# Patient Record
Sex: Male | Born: 1965 | Race: White | Hispanic: No | Marital: Married | Smoking: Current every day smoker
Health system: Southern US, Community
[De-identification: ages and names within clinical notes are randomized; demographics above are authoritative.]

## PROBLEM LIST (undated history)

## (undated) DIAGNOSIS — R569 Unspecified convulsions: Secondary | ICD-10-CM

---

## 2011-01-02 ENCOUNTER — Emergency Department (HOSPITAL_COMMUNITY)
Admission: EM | Admit: 2011-01-02 | Discharge: 2011-01-02 | Disposition: A | Discharge status: Home / Self Care | Payer: 59 | Attending: Emergency Medicine | Admitting: Emergency Medicine

## 2011-01-02 DIAGNOSIS — H9209 Otalgia, unspecified ear: Secondary | ICD-10-CM | POA: Insufficient documentation

## 2011-01-02 DIAGNOSIS — H6500 Acute serous otitis media, unspecified ear: Secondary | ICD-10-CM | POA: Insufficient documentation

## 2015-06-08 ENCOUNTER — Emergency Department (HOSPITAL_COMMUNITY)
Admission: EM | Admit: 2015-06-08 | Discharge: 2015-06-08 | Disposition: A | Discharge status: Home / Self Care | Payer: 59 | Attending: Emergency Medicine | Admitting: Emergency Medicine

## 2015-06-08 ENCOUNTER — Encounter (HOSPITAL_COMMUNITY): Payer: Self-pay | Admitting: Emergency Medicine

## 2015-06-08 ENCOUNTER — Emergency Department (HOSPITAL_COMMUNITY)
Admission: EM | Admit: 2015-06-08 | Discharge: 2015-06-08 | Payer: 59 | Source: Home / Self Care | Attending: Family Medicine | Admitting: Family Medicine

## 2015-06-08 DIAGNOSIS — K047 Periapical abscess without sinus: Secondary | ICD-10-CM | POA: Diagnosis not present

## 2015-06-08 DIAGNOSIS — Z79899 Other long term (current) drug therapy: Secondary | ICD-10-CM | POA: Diagnosis not present

## 2015-06-08 DIAGNOSIS — G40909 Epilepsy, unspecified, not intractable, without status epilepticus: Secondary | ICD-10-CM | POA: Diagnosis not present

## 2015-06-08 DIAGNOSIS — Z720 Tobacco use: Secondary | ICD-10-CM | POA: Diagnosis not present

## 2015-06-08 DIAGNOSIS — R51 Headache: Secondary | ICD-10-CM | POA: Diagnosis present

## 2015-06-08 DIAGNOSIS — R197 Diarrhea, unspecified: Secondary | ICD-10-CM | POA: Insufficient documentation

## 2015-06-08 HISTORY — DX: Unspecified convulsions: R56.9

## 2015-06-08 LAB — I-STAT CHEM 8, ED
BUN: 6 mg/dL (ref 6–20)
CHLORIDE: 102 mmol/L (ref 101–111)
CREATININE: 0.6 mg/dL — AB (ref 0.61–1.24)
Calcium, Ion: 1.13 mmol/L (ref 1.12–1.23)
Glucose, Bld: 108 mg/dL — ABNORMAL HIGH (ref 65–99)
HCT: 42 % (ref 39.0–52.0)
HEMOGLOBIN: 14.3 g/dL (ref 13.0–17.0)
POTASSIUM: 3.7 mmol/L (ref 3.5–5.1)
SODIUM: 134 mmol/L — AB (ref 135–145)
TCO2: 22 mmol/L (ref 0–100)

## 2015-06-08 LAB — CBC WITH DIFFERENTIAL/PLATELET
Basophils Absolute: 0 10*3/uL (ref 0.0–0.1)
Basophils Relative: 0 % (ref 0–1)
Eosinophils Absolute: 0 10*3/uL (ref 0.0–0.7)
Eosinophils Relative: 1 % (ref 0–5)
HCT: 39.4 % (ref 39.0–52.0)
HEMOGLOBIN: 13.8 g/dL (ref 13.0–17.0)
LYMPHS ABS: 1.2 10*3/uL (ref 0.7–4.0)
Lymphocytes Relative: 24 % (ref 12–46)
MCH: 30.7 pg (ref 26.0–34.0)
MCHC: 35 g/dL (ref 30.0–36.0)
MCV: 87.8 fL (ref 78.0–100.0)
MONO ABS: 0.4 10*3/uL (ref 0.1–1.0)
Monocytes Relative: 9 % (ref 3–12)
NEUTROS PCT: 67 % (ref 43–77)
Neutro Abs: 3.3 10*3/uL (ref 1.7–7.7)
Platelets: DECREASED 10*3/uL (ref 150–400)
RBC: 4.49 MIL/uL (ref 4.22–5.81)
RDW: 12.4 % (ref 11.5–15.5)
WBC: 4.9 10*3/uL (ref 4.0–10.5)

## 2015-06-08 MED ORDER — OXYCODONE-ACETAMINOPHEN 5-325 MG PO TABS
1.0000 | ORAL_TABLET | Freq: Once | ORAL | Status: AC
  Administered 2015-06-08: 1 via ORAL

## 2015-06-08 MED ORDER — IBUPROFEN 600 MG PO TABS: 600.0000 mg | ORAL_TABLET | Freq: Four times a day (QID) | ORAL | Status: DC | PRN

## 2015-06-08 MED ORDER — HYDROCODONE-ACETAMINOPHEN 5-325 MG PO TABS: 1.0000 | ORAL_TABLET | Freq: Four times a day (QID) | ORAL | Status: DC | PRN

## 2015-06-08 MED ORDER — CLINDAMYCIN PHOSPHATE 600 MG/50ML IV SOLN
600.0000 mg | Freq: Once | INTRAVENOUS | Status: AC
  Administered 2015-06-08: 600 mg via INTRAVENOUS
  Filled 2015-06-08: qty 50

## 2015-06-08 MED ORDER — CLINDAMYCIN HCL 150 MG PO CAPS: 300.0000 mg | ORAL_CAPSULE | Freq: Three times a day (TID) | ORAL | Status: DC

## 2015-06-08 MED ORDER — OXYCODONE-ACETAMINOPHEN 5-325 MG PO TABS
ORAL_TABLET | ORAL | Status: AC
  Filled 2015-06-08: qty 1

## 2015-06-08 MED ORDER — KETOROLAC TROMETHAMINE 30 MG/ML IJ SOLN
30.0000 mg | Freq: Once | INTRAMUSCULAR | Status: AC
  Administered 2015-06-08: 30 mg via INTRAVENOUS
  Filled 2015-06-08: qty 1

## 2015-06-08 NOTE — ED Notes (Signed)
Pt st's he woke up with am with pain and swelling to left side of face,  Pt denies any tooth pain.  Pt was seen at Urgent Care and sent to ED for further eval.   Pt also st's he has had diarrhea and elevated temp off and on x's 2 weeks.

## 2015-06-08 NOTE — ED Provider Notes (Signed)
CSN: [id number]     Arrival date & time 06/08/15  1811 History   First MD Initiated Contact with Patient 06/08/15 2006     Chief Complaint  Patient presents with  . Influenza  . Facial Swelling   (Consider location/radiation/quality/duration/timing/severity/associated sxs/prior Treatment) Patient is a 49 y.o. male presenting with tooth pain. The history is provided by the patient and the spouse.  Dental Pain Location:  Upper Upper teeth location:  11/LU cuspid Quality:  Throbbing Severity:  Moderate Onset quality:  Gradual Duration:  1 week Progression:  Worsening (sudden swelling this am to left maxilla) Chronicity:  New Context: abscess, dental caries and poor dentition   Relieved by:  None tried Worsened by:  Nothing tried Associated symptoms: facial swelling, fever and gum swelling     History reviewed. No pertinent past medical history. History reviewed. No pertinent past surgical history. No family history on file. History  Substance Use Topics  . Smoking status: Not on file  . Smokeless tobacco: Not on file  . Alcohol Use: Not on file    Review of Systems  Constitutional: Positive for fever.  HENT: Positive for dental problem and facial swelling.     Allergies  Review of patient's allergies indicates no known allergies.  Home Medications   Prior to Admission medications   Medication Sig Start Date End Date Taking? Authorizing Provider  phenytoin (DILANTIN) 100 MG ER capsule Take by mouth 2 (two) times daily.   Yes Historical Provider, MD   BP 126/74 mmHg  Pulse 88  Temp(Src) 101.4 F (38.6 C) (Oral)  Resp 16  SpO2 98% Physical Exam  Constitutional: He is oriented to person, place, and time. He appears well-developed and well-nourished. He appears distressed.  HENT:  Mouth/Throat: Abnormal dentition. Dental abscesses and dental caries present.    Neurological: He is alert and oriented to person, place, and time.  Skin: Skin is warm. He is  diaphoretic.  Nursing note and vitals reviewed.   ED Course  Procedures (including critical care time) Labs Review Labs Reviewed - No data to display  Imaging Review No results found.   MDM   1. Periapical abscess with facial involvement   sent for eval of prob dental abscess , facial swelling and fever.    Billy Fischer, MD 06/08/15 2019

## 2015-06-08 NOTE — ED Provider Notes (Signed)
CSN: [id number]     Arrival date & time 06/08/15  2030 History  This chart was scribed for non-physician provider Jeannett Senior, PA-C, working with Ernestina Patches, MD by Irene Pap, ED Scribe. This patient was seen in room TR02C/TR02C and patient care was started at 10:07 PM.   Chief Complaint  Patient presents with  . Abscess   The history is provided by the patient. No language interpreter was used.  HPI Comments: Dale Mason is a 49 y.o. male with a hx of seizures who presents to the Emergency Department complaining of left facial pain and swelling with a cut to the left corner of his mouth onset earlier this morning when he woke up. He thought that he had a seizure last night to attribute to the pain, but wife states that he has not had a seizure in 10 years. Pt states that he was seen at Urgent Care and was sent to the ED for further evaluation. Pt also reports intermittent diarrhea and fever onset two weeks ago. Per triage note, pt's temp is currently 100.5 F, but was not given anything for the fever; states that he received percocet for pain. He denies any dental pain, cough and abdominal pain.    Past Medical History  Diagnosis Date  . Seizures    History reviewed. No pertinent past surgical history. No family history on file. History  Substance Use Topics  . Smoking status: Current Every Day Smoker  . Smokeless tobacco: Not on file  . Alcohol Use: Yes     Comment: beer daily    Review of Systems  Constitutional: Positive for fever. Negative for chills.  HENT: Positive for facial swelling. Negative for dental problem.   Respiratory: Negative for cough.   Gastrointestinal: Positive for diarrhea. Negative for nausea, vomiting and abdominal pain.   Allergies  Review of patient's allergies indicates no known allergies.  Home Medications   Prior to Admission medications   Medication Sig Start Date End Date Taking? Authorizing Provider  phenytoin (DILANTIN) 100 MG ER  capsule Take by mouth 2 (two) times daily.    Historical Provider, MD   BP 129/73 mmHg  Pulse 91  Temp(Src) 100.5 F (38.1 C) (Oral)  Resp 18  SpO2 97%  Physical Exam  Constitutional: He is oriented to person, place, and time. He appears well-developed and well-nourished.  HENT:  Head: Normocephalic and atraumatic.  Poor dentition, left upper first and second bicuspids with DKA, surrounding gum swelling, tenderness to palpation. There is swelling over left maxilla. Tender to palpation. No trismus. No swelling in the tongue.  Eyes: EOM are normal.  Neck: Normal range of motion. Neck supple.  Cardiovascular: Normal rate, regular rhythm and normal heart sounds.   Pulmonary/Chest: Effort normal and breath sounds normal. No respiratory distress. He has no wheezes. He has no rales.  Musculoskeletal: Normal range of motion.  Lymphadenopathy:    He has no cervical adenopathy.  Neurological: He is alert and oriented to person, place, and time.  Skin: Skin is warm and dry.  Psychiatric: He has a normal mood and affect. His behavior is normal.  Nursing note and vitals reviewed.  ED Course  Procedures (including critical care time) DIAGNOSTIC STUDIES: Oxygen Saturation is 97% on RA, normal by my interpretation.    COORDINATION OF CARE: 10:10 PM-Discussed treatment plan which includes I&D of tooth abscess, anti-biotics and labs with pt at bedside and pt agreed to plan.   INCISION AND DRAINAGE PROCEDURE NOTE: Patient identification was  confirmed and verbal consent was obtained. This procedure was performed by Jeannett Senior, PA-C, at 10:30 PM. Site: left upper mouth Sterile procedures observed Needle size: 27 Anesthetic used (type and amt): bupivacaine 0.25% Blade size: 11 Drainage: NONE Complexity: Complex Packing used none Site anesthetized, incision made over site, wound drained and explored loculations, rinsed with copious amounts of normal saline, wound packed with sterile  gauze, covered with dry, sterile dressing.  Pt tolerated procedure well without complications.  Instructions for care discussed verbally and pt provided with additional written instructions for homecare and f/u.  NERVE BLOCK Performed by: Jeannett Senior A Consent: Verbal consent obtained. Required items: required blood products, implants, devices, and special equipment available Time out: Immediately prior to procedure a "time out" was called to verify the correct patient, procedure, equipment, support staff and site/side marked as required.  Indication: dental abscess Nerve block body site: left infraorbital  Preparation: Patient was prepped and draped in the usual sterile fashion. Needle gauge: 52 G Location technique: anatomical landmarks  Local anesthetic: bupivacaine 0.25%  Anesthetic total: 15ml  Outcome: pain improved Patient tolerance: Patient tolerated the procedure well with no immediate complications.    Labs Review Labs Reviewed  I-STAT CHEM 8, ED - Abnormal; Notable for the following:    Sodium 134 (*)    Creatinine, Ser 0.60 (*)    Glucose, Bld 108 (*)    All other components within normal limits  CBC WITH DIFFERENTIAL/PLATELET   Imaging Review No results found.   EKG Interpretation None      MDM   Final diagnoses:  Dental abscess     patient with fever, left facial swelling, poor dentition and gum swelling. Exam consistent with apical abscess. Attempted to incise and drain with no drainage. Patient felt much better after IV fluids, Toradol, Percocet. Also after dental block. Clindamycin 690 g IV given in emergency department. We'll discharge home with ibuprofen, Norco, clindamycin. Follow up with her oral surgeon. Patient otherwise nontoxic appearing. No evidence of sepsis given normal vital signs. WBC normal. He has no other complaints at this time.  Filed Vitals:   06/08/15 2048  BP: 129/73  Pulse: 91  Temp: 100.5 F (38.1 C)  TempSrc:  Oral  Resp: 18  SpO2: 97%    I personally performed the services described in this documentation, which was scribed in my presence. The recorded information has been reviewed and is accurate.    Jeannett Senior, PA-C 06/08/15 2344  Ernestina Patches, MD 06/10/15 4235

## 2015-06-08 NOTE — Discharge Instructions (Signed)
Ibuprofen for pain. norco for severe pain. Clindamycin for infection. Follow up with a dentist or oral surgeon. Return if worsening   Dental Abscess A dental abscess is a collection of infected fluid (pus) from a bacterial infection in the inner part of the tooth (pulp). It usually occurs at the end of the tooth's root.  CAUSES   Severe tooth decay.  Trauma to the tooth that allows bacteria to enter into the pulp, such as a broken or chipped tooth. SYMPTOMS   Severe pain in and around the infected tooth.  Swelling and redness around the abscessed tooth or in the mouth or face.  Tenderness.  Pus drainage.  Bad breath.  Bitter taste in the mouth.  Difficulty swallowing.  Difficulty opening the mouth.  Nausea.  Vomiting.  Chills.  Swollen neck glands. DIAGNOSIS   A medical and dental history will be taken.  An examination will be performed by tapping on the abscessed tooth.  X-rays may be taken of the tooth to identify the abscess. TREATMENT The goal of treatment is to eliminate the infection. You may be prescribed antibiotic medicine to stop the infection from spreading. A root canal may be performed to save the tooth. If the tooth cannot be saved, it may be pulled (extracted) and the abscess may be drained.  HOME CARE INSTRUCTIONS  Only take over-the-counter or prescription medicines for pain, fever, or discomfort as directed by your caregiver.  Rinse your mouth (gargle) often with salt water ( tsp salt in 8 oz [250 ml] of warm water) to relieve pain or swelling.  Do not drive after taking pain medicine (narcotics).  Do not apply heat to the outside of your face.  Return to your dentist for further treatment as directed. SEEK MEDICAL CARE IF:  Your pain is not helped by medicine.  Your pain is getting worse instead of better. SEEK IMMEDIATE MEDICAL CARE IF:  You have a fever or persistent symptoms for more than 2-3 days.  You have a fever and your  symptoms suddenly get worse.  You have chills or a very bad headache.  You have problems breathing or swallowing.  You have trouble opening your mouth.  You have swelling in the neck or around the eye. Document Released: 11/18/2005 Document Revised: 08/12/2012 Document Reviewed: 02/26/2011 [facility] Patient Information 2015 [place], [place]. This information is not intended to replace advice given to you by your health care provider. Make sure you discuss any questions you have with your health care provider.

## 2015-06-08 NOTE — ED Notes (Addendum)
Pt comes in with c/o flu like sx's, muscle spasms with fever, left jaw swelling x1 week States he pulled a tick out left thigh last week Fever, chills with episode of diarrhea Hx Seizures, controlled with medication

## 2016-02-25 ENCOUNTER — Emergency Department (HOSPITAL_COMMUNITY): Payer: 59

## 2016-02-25 ENCOUNTER — Emergency Department (HOSPITAL_COMMUNITY)
Admission: EM | Admit: 2016-02-25 | Discharge: 2016-02-25 | Disposition: A | Discharge status: Home / Self Care | Payer: 59 | Attending: Emergency Medicine | Admitting: Emergency Medicine

## 2016-02-25 ENCOUNTER — Encounter (HOSPITAL_COMMUNITY): Payer: Self-pay | Admitting: Emergency Medicine

## 2016-02-25 DIAGNOSIS — S6992XA Unspecified injury of left wrist, hand and finger(s), initial encounter: Secondary | ICD-10-CM | POA: Diagnosis present

## 2016-02-25 DIAGNOSIS — Y929 Unspecified place or not applicable: Secondary | ICD-10-CM | POA: Diagnosis not present

## 2016-02-25 DIAGNOSIS — S62337A Displaced fracture of neck of fifth metacarpal bone, left hand, initial encounter for closed fracture: Secondary | ICD-10-CM | POA: Insufficient documentation

## 2016-02-25 DIAGNOSIS — Z79899 Other long term (current) drug therapy: Secondary | ICD-10-CM | POA: Insufficient documentation

## 2016-02-25 DIAGNOSIS — Y999 Unspecified external cause status: Secondary | ICD-10-CM | POA: Insufficient documentation

## 2016-02-25 DIAGNOSIS — Y939 Activity, unspecified: Secondary | ICD-10-CM | POA: Insufficient documentation

## 2016-02-25 DIAGNOSIS — W228XXA Striking against or struck by other objects, initial encounter: Secondary | ICD-10-CM | POA: Diagnosis not present

## 2016-02-25 DIAGNOSIS — S62339A Displaced fracture of neck of unspecified metacarpal bone, initial encounter for closed fracture: Secondary | ICD-10-CM

## 2016-02-25 MED ORDER — HYDROCODONE-ACETAMINOPHEN 5-325 MG PO TABS: 1.0000 | ORAL_TABLET | ORAL | Status: DC | PRN

## 2016-02-25 NOTE — ED Provider Notes (Signed)
CSN: [id number]     Arrival date & time 02/25/16  1113 History  By signing my name below, I, Hansel Feinstein, attest that this documentation has been prepared under the direction and in the presence of Evalee Jefferson, PA-C. Electronically Signed: Hansel Feinstein, ED Scribe. 02/25/2016. 12:30 PM.    Chief Complaint  Patient presents with  . Hand Pain   The history is provided by the patient. No language interpreter was used.   HPI Comments: Dale Mason is a 50 y.o. male who is right hand dominant who presents to the Emergency Department complaining of moderate medial left hand pain and swelling onset 3 weeks ago s/p injury and worsened today s/p reinjury one hour ago. Pt states that 3 weeks ago, a floor jack fell onto his left hand, causing his initial injury. He states he was not evaluated by a physician after this injury. He reports that today, he accidentally hit his left hand on the floor, exacerbating his existing pain. He is able to bend his fingers, but his ROM has been limited today since reinjury d/t increased pain and swelling. He notes that he applied ice with no relief of pain. Pt denies taking OTC medications at home to improve symptoms. Pt is seen at [facility]. He denies numbness, additional injuries.   Past Medical History  Diagnosis Date  . Seizures (George Mason)    History reviewed. No pertinent past surgical history. History reviewed. No pertinent family history. Social History  Substance Use Topics  . Smoking status: Current Every Day Smoker  . Smokeless tobacco: None  . Alcohol Use: Yes     Comment: beer daily    Review of Systems  Musculoskeletal: Positive for joint swelling and arthralgias.  Neurological: Negative for numbness.   Allergies  Review of patient's allergies indicates no known allergies.  Home Medications   Prior to Admission medications   Medication Sig Start Date End Date Taking? Authorizing Provider  phenytoin (DILANTIN) 100 MG ER capsule Take 200-300 mg by  mouth 2 (two) times daily. Take 2 capsules in the morning and 3 capsules at night.   Yes Historical Provider, MD  HYDROcodone-acetaminophen (NORCO/VICODIN) 5-325 MG tablet Take 1 tablet by mouth every 4 (four) hours as needed. 02/25/16   Evalee Jefferson, PA-C   BP 132/72 mmHg  Pulse 75  Temp(Src) 98.6 F (37 C) (Oral)  Resp 18  SpO2 100% Physical Exam  Constitutional: He is oriented to person, place, and time. He appears well-developed and well-nourished.  HENT:  Head: Normocephalic and atraumatic.  Eyes: Conjunctivae and EOM are normal. Pupils are equal, round, and reactive to light.  Neck: Normal range of motion. Neck supple.  Cardiovascular: Normal rate.   Pulmonary/Chest: Effort normal. No respiratory distress.  Abdominal: He exhibits no distension.  Musculoskeletal: Normal range of motion. He exhibits tenderness. He exhibits no edema.  Tenderness to palpation with deformity at his left 5th distal metacarpal. Sensation intact distal to the injury site. He can flex and extend the finger, but cannot make a full fist. Skin intact. Capillary refill less than 3 seconds. Radial pulse 2+. No obvious edema or ecchymosis. Pt is right hand dominant.    Neurological: He is alert and oriented to person, place, and time.  Skin: Skin is warm and dry.  Psychiatric: He has a normal mood and affect. His behavior is normal.  Nursing note and vitals reviewed.   ED Course  Procedures (including critical care time) DIAGNOSTIC STUDIES: Oxygen Saturation is 100% on RA, normal by  my interpretation.    COORDINATION OF CARE: 12:22 PM Discussed treatment plan with pt at bedside which includes XR and pt agreed to plan.  Imaging Review Dg Hand Complete Left  02/25/2016  CLINICAL DATA:  Fall, hit left hand, fifth metacarpal area swelling, EXAM: LEFT HAND - COMPLETE 3+ VIEW COMPARISON:  None. FINDINGS: Three views of the left hand submitted. There is healing mild impacted fracture distal aspect of left fifth  metacarpal. Mild adjacent soft tissue swelling. IMPRESSION: Healing mild impacted fracture distal aspect of left fifth metacarpal. Mild adjacent soft tissue swelling. Electronically Signed   By: Lahoma Crocker M.D.   On: 02/25/2016 12:09   I have personally reviewed and evaluated these images as part of my medical decision-making.   MDM   Final diagnoses:  Boxer's fracture, closed, initial encounter    Ulnar gutter splint applied, sling.  Ice, elevation, hydrocodone. Referral to Dr Aline Brochure for f/u care.  Pt aware need to call for f/u.   I personally performed the services described in this documentation, which was scribed in my presence. The recorded information has been reviewed and is accurate.   Evalee Jefferson, PA-C 02/25/16 [place], MD 02/27/16 1255

## 2016-02-25 NOTE — ED Notes (Signed)
Pt states he had a jack fall on his left hand over a month ago and today hit it again and is having pain.

## 2016-02-25 NOTE — Discharge Instructions (Signed)
Cast or Splint Care °Casts and splints support injured limbs and keep bones from moving while they heal. It is important to care for your cast or splint at home.   °HOME CARE INSTRUCTIONS °· Keep the cast or splint uncovered during the drying period. It can take 24 to 48 hours to dry if it is made of plaster. A fiberglass cast will dry in less than 1 hour. °· Do not rest the cast on anything harder than a pillow for the first 24 hours. °· Do not put weight on your injured limb or apply pressure to the cast until your health care provider gives you permission. °· Keep the cast or splint dry. Wet casts or splints can lose their shape and may not support the limb as well. A wet cast that has lost its shape can also create harmful pressure on your skin when it dries. Also, wet skin can become infected. °· Cover the cast or splint with a plastic bag when bathing or when out in the rain or snow. If the cast is on the trunk of the body, take sponge baths until the cast is removed. °· If your cast does become wet, dry it with a towel or a blow dryer on the cool setting only. °· Keep your cast or splint clean. Soiled casts may be wiped with a moistened cloth. °· Do not place any hard or soft foreign objects under your cast or splint, such as cotton, toilet paper, lotion, or powder. °· Do not try to scratch the skin under the cast with any object. The object could get stuck inside the cast. Also, scratching could lead to an infection. If itching is a problem, use a blow dryer on a cool setting to relieve discomfort. °· Do not trim or cut your cast or remove padding from inside of it. °· Exercise all joints next to the injury that are not immobilized by the cast or splint. For example, if you have a long leg cast, exercise the hip joint and toes. If you have an arm cast or splint, exercise the shoulder, elbow, thumb, and fingers. °· Elevate your injured arm or leg on 1 or 2 pillows for the first 1 to 3 days to decrease  swelling and pain. It is best if you can comfortably elevate your cast so it is higher than your heart. °SEEK MEDICAL CARE IF:  °· Your cast or splint cracks. °· Your cast or splint is too tight or too loose. °· You have unbearable itching inside the cast. °· Your cast becomes wet or develops a soft spot or area. °· You have a bad smell coming from inside your cast. °· You get an object stuck under your cast. °· Your skin around the cast becomes red or raw. °· You have new pain or worsening pain after the cast has been applied. °SEEK IMMEDIATE MEDICAL CARE IF:  °· You have fluid leaking through the cast. °· You are unable to move your fingers or toes. °· You have discolored (blue or white), cool, painful, or very swollen fingers or toes beyond the cast. °· You have tingling or numbness around the injured area. °· You have severe pain or pressure under the cast. °· You have any difficulty with your breathing or have shortness of breath. °· You have chest pain. °  °This information is not intended to replace advice given to you by your health care provider. Make sure you discuss any questions you have with your health care   provider. °  °Document Released: 11/15/2000 Document Revised: 09/08/2013 Document Reviewed: 05/27/2013 °Elsevier Interactive Patient Education ©2016 [employer] ° °Metacarpal Fracture °A metacarpal fracture is a break (fracture) of a bone in the hand. Metacarpals are the bones that extend from your knuckles to your wrist. In each hand, you have five metacarpal bones that connect your fingers and your thumb to your wrist. °Some hand fractures have bone pieces that are close together and stable (simple). These fractures may be treated with only a splint or cast. Hand fractures that have many pieces of broken bone (comminuted), unstable bone pieces (displaced), or a bone that breaks through the skin (compound) usually require surgery. °CAUSES °This injury may be caused by: °· A fall. °· A hard,  direct hit to your hand. °· An injury that squeezes your knuckle, stretches your finger out of place, or crushes your hand. °RISK FACTORS °This injury is more likely to occur if: °· You play contact sports. °· You have certain bone diseases. °SYMPTOMS  °Symptoms of this type of fracture develop soon after the injury. Symptoms may include: °· Swelling. °· Pain. °· Stiffness. °· Increased pain with movement. °· Bruising. °· Inability to move a finger. °· A shortened finger. °· A finger knuckle that looks sunken in. °· Unusual appearance of the hand or finger (deformity). °DIAGNOSIS  °This injury may be diagnosed based on your signs and symptoms, especially if you had a recent hand injury. Your health care provider will perform a physical exam. He or she may also order X-rays to confirm the diagnosis.  °TREATMENT  °Treatment for this injury depends on the type of fracture you have and how severe it is. Possible treatments include: °· Non-reduction. This can be done if the bone does not need to be moved back into place. The fracture can be casted or splinted as it is.   °· Closed reduction. If your bone is stable and can be moved back into place, you may only need to wear a cast or splint or have buddy taping. °· Closed reduction with internal fixation (CRIF). This is the most common treatment. You may have this procedure if your bone can be moved back into place but needs more support. Wires, pins, or screws may be inserted through your skin to stabilize the fracture. °· Open reduction with internal fixation (ORIF). This may be needed if your fracture is severe and unstable. It involves surgery to move your bone back into the right position. Screws, wires, or plates are used to stabilize the fracture. °After all procedures, you may need to wear a cast or a splint for several weeks. You will also need to have follow-up X-rays to make sure that the bone is healing well and staying in position. After you no longer need  your cast or splint, you may need physical therapy. This will help you to regain full movement and strength in your hand.  °HOME CARE INSTRUCTIONS  °If You Have a Cast: °· Do not stick anything inside the cast to scratch your skin. Doing that increases your risk of infection. °· Check the skin around the cast every day. Report any concerns to your health care provider. You may put lotion on dry skin around the edges of the cast. Do not apply lotion to the skin underneath the cast. °If You Have a Splint: °· Wear it as directed by your health care provider. Remove it only as directed by your health care provider. °· Loosen the splint if your   fingers become numb and tingle, or if they turn cold and blue. Bathing  Cover the cast or splint with a watertight plastic bag to protect it from water while you take a bath or a shower. Do not let the cast or splint get wet. Managing Pain, Stiffness, and Swelling  If directed, apply ice to the injured area (if you have a splint, not a cast):  Put ice in a plastic bag.  Place a towel between your skin and the bag.  Leave the ice on for 20 minutes, 2-3 times a day.  Move your fingers often to avoid stiffness and to lessen swelling.  Raise the injured area above the level of your heart while you are sitting or lying down. Driving  Do not drive or operate heavy machinery while taking pain medicine.  Do not drive while wearing a cast or splint on a hand that you use for driving. Activity  Return to your normal activities as directed by your health care provider. Ask your health care provider what activities are safe for you. General Instructions  Do not put pressure on any part of the cast or splint until it is fully hardened. This may take several hours.  Keep the cast or splint clean and dry.  Do not use any tobacco products, including cigarettes, chewing tobacco, or electronic cigarettes. Tobacco can delay bone healing. If you need help quitting, ask  your health care provider.  Take medicines only as directed by your health care provider.  Keep all follow-up visits as directed by your health care provider. This is important. SEEK MEDICAL CARE IF:   Your pain is getting worse.  You have redness, swelling, or pain in the injured area.   You have fluid, blood, or pus coming from under your cast or splint.   You notice a bad smell coming from under your cast or splint.   You have a fever.  SEEK IMMEDIATE MEDICAL CARE IF:   You develop a rash.   You have trouble breathing.   Your skin or nails on your injured hand turn blue or gray even after you loosen your splint.  Your injured hand feels cold or becomes numb even after you loosen your splint.   You develop severe pain under the cast or in your hand.   This information is not intended to replace advice given to you by your health care provider. Make sure you discuss any questions you have with your health care provider.   Document Released: 11/18/2005 Document Revised: 08/09/2015 Document Reviewed: 09/07/2014 Elsevier Interactive Patient Education 2016 [place] may take the hydrocodone prescribed for pain relief.  This will make you drowsy - do not drive within 4 hours of taking this medication.

## 2016-12-06 DIAGNOSIS — Z23 Encounter for immunization: Secondary | ICD-10-CM | POA: Diagnosis not present

## 2017-06-12 DIAGNOSIS — Z6822 Body mass index (BMI) 22.0-22.9, adult: Secondary | ICD-10-CM | POA: Diagnosis not present

## 2017-06-12 DIAGNOSIS — J029 Acute pharyngitis, unspecified: Secondary | ICD-10-CM | POA: Diagnosis not present

## 2017-06-23 DIAGNOSIS — Z0000 Encounter for general adult medical examination without abnormal findings: Secondary | ICD-10-CM | POA: Diagnosis not present

## 2017-06-26 DIAGNOSIS — Z23 Encounter for immunization: Secondary | ICD-10-CM | POA: Diagnosis not present

## 2017-06-26 DIAGNOSIS — E782 Mixed hyperlipidemia: Secondary | ICD-10-CM | POA: Diagnosis not present

## 2017-06-26 DIAGNOSIS — Z6821 Body mass index (BMI) 21.0-21.9, adult: Secondary | ICD-10-CM | POA: Diagnosis not present

## 2017-06-26 DIAGNOSIS — Z0000 Encounter for general adult medical examination without abnormal findings: Secondary | ICD-10-CM | POA: Diagnosis not present

## 2017-06-28 DIAGNOSIS — Z6821 Body mass index (BMI) 21.0-21.9, adult: Secondary | ICD-10-CM | POA: Diagnosis not present

## 2017-06-28 DIAGNOSIS — L03113 Cellulitis of right upper limb: Secondary | ICD-10-CM | POA: Diagnosis not present

## 2017-08-29 DIAGNOSIS — D128 Benign neoplasm of rectum: Secondary | ICD-10-CM | POA: Diagnosis not present

## 2017-08-29 DIAGNOSIS — K621 Rectal polyp: Secondary | ICD-10-CM | POA: Diagnosis not present

## 2017-08-29 DIAGNOSIS — D125 Benign neoplasm of sigmoid colon: Secondary | ICD-10-CM | POA: Diagnosis not present

## 2017-08-29 DIAGNOSIS — Z1211 Encounter for screening for malignant neoplasm of colon: Secondary | ICD-10-CM | POA: Diagnosis not present

## 2017-09-15 DIAGNOSIS — D126 Benign neoplasm of colon, unspecified: Secondary | ICD-10-CM | POA: Insufficient documentation

## 2017-09-15 DIAGNOSIS — K621 Rectal polyp: Secondary | ICD-10-CM | POA: Insufficient documentation

## 2017-12-16 DIAGNOSIS — E782 Mixed hyperlipidemia: Secondary | ICD-10-CM | POA: Diagnosis not present

## 2017-12-18 DIAGNOSIS — E782 Mixed hyperlipidemia: Secondary | ICD-10-CM | POA: Diagnosis not present

## 2018-03-23 DIAGNOSIS — E782 Mixed hyperlipidemia: Secondary | ICD-10-CM | POA: Diagnosis not present

## 2018-03-23 DIAGNOSIS — F17210 Nicotine dependence, cigarettes, uncomplicated: Secondary | ICD-10-CM | POA: Diagnosis not present

## 2018-03-23 DIAGNOSIS — G40309 Generalized idiopathic epilepsy and epileptic syndromes, not intractable, without status epilepticus: Secondary | ICD-10-CM | POA: Diagnosis not present

## 2018-04-21 DIAGNOSIS — G40309 Generalized idiopathic epilepsy and epileptic syndromes, not intractable, without status epilepticus: Secondary | ICD-10-CM | POA: Diagnosis not present

## 2018-05-25 DIAGNOSIS — G40309 Generalized idiopathic epilepsy and epileptic syndromes, not intractable, without status epilepticus: Secondary | ICD-10-CM | POA: Diagnosis not present

## 2018-09-22 DIAGNOSIS — Z0001 Encounter for general adult medical examination with abnormal findings: Secondary | ICD-10-CM | POA: Diagnosis not present

## 2018-09-22 DIAGNOSIS — F17210 Nicotine dependence, cigarettes, uncomplicated: Secondary | ICD-10-CM | POA: Diagnosis not present

## 2018-09-22 DIAGNOSIS — E782 Mixed hyperlipidemia: Secondary | ICD-10-CM | POA: Diagnosis not present

## 2019-09-26 ENCOUNTER — Emergency Department (HOSPITAL_COMMUNITY): Payer: 59

## 2019-09-26 ENCOUNTER — Emergency Department (HOSPITAL_COMMUNITY)
Admission: EM | Admit: 2019-09-26 | Discharge: 2019-09-26 | Disposition: A | Discharge status: Home / Self Care | Payer: 59 | Attending: Emergency Medicine | Admitting: Emergency Medicine

## 2019-09-26 ENCOUNTER — Encounter (HOSPITAL_COMMUNITY): Payer: Self-pay | Admitting: *Deleted

## 2019-09-26 ENCOUNTER — Other Ambulatory Visit: Payer: Self-pay

## 2019-09-26 DIAGNOSIS — M25562 Pain in left knee: Secondary | ICD-10-CM

## 2019-09-26 MED ORDER — NAPROXEN 500 MG PO TABS: 500.0000 mg | ORAL_TABLET | Freq: Two times a day (BID) | ORAL | 0 refills | Status: DC | PRN

## 2019-09-26 MED ORDER — HYDROCODONE-ACETAMINOPHEN 5-325 MG PO TABS
1.0000 | ORAL_TABLET | Freq: Once | ORAL | Status: AC
  Administered 2019-09-26: 03:00:00 1 via ORAL
  Filled 2019-09-26: qty 1

## 2019-09-26 NOTE — ED Notes (Signed)
Pt ambulated to BR without any assistance 

## 2019-09-26 NOTE — ED Triage Notes (Signed)
Pt was roller skating tonight and fell, c/o pain to left knee,

## 2019-09-26 NOTE — ED Provider Notes (Signed)
[facility] EMERGENCY DEPARTMENT Provider Note   CSN: [id number] Arrival date & time: 09/26/19  0246     History   Chief Complaint Chief Complaint  Patient presents with  . Knee Pain    HPI Dale Mason is a 53 y.o. male.     Patient states he fell while rollerskating about 2 hours ago.  He landed on his right knee but twisted his left knee in the process.  His right knee does not hurt.  He believes his left lower leg externally rotated and now is having left lateral knee pain.  He took Tylenol and ibuprofen at home without relief.  He has pain with weightbearing has been having to use a cane.  He states he felt something pop.  Denies hitting his head or losing consciousness.  No neck or back pain.  Had some left wrist pain earlier but this has since resolved.  No chest pain or abdominal pain. No focal weakness, numbness or tingling.  The history is provided by the patient.  Knee Pain Associated symptoms: no fever     Past Medical History:  Diagnosis Date  . Seizures ([place])     There are no active problems to display for this patient.   History reviewed. No pertinent surgical history.      Home Medications    Prior to Admission medications   Medication Sig Start Date End Date Taking? Authorizing Provider  HYDROcodone-acetaminophen (NORCO/VICODIN) 5-325 MG tablet Take 1 tablet by mouth every 4 (four) hours as needed. 02/25/16   Evalee Jefferson, PA-C  phenytoin (DILANTIN) 100 MG ER capsule Take 200-300 mg by mouth 2 (two) times daily. Take 2 capsules in the morning and 3 capsules at night.    [provider]    Family History No family history on file.  Social History Social History   Tobacco Use  . Smoking status: Current Every Day Smoker  Substance Use Topics  . Alcohol use: Yes    Comment: beer daily  . Drug use: Not on file     Allergies   Patient has no known allergies.   Review of Systems Review of Systems  Constitutional: Negative for  fever.  Respiratory: Negative for cough, chest tightness and shortness of breath.   Gastrointestinal: Negative for abdominal pain, nausea and vomiting.  Musculoskeletal: Positive for arthralgias and myalgias.  Neurological: Negative for dizziness, weakness and light-headedness.    all other systems are negative except as noted in the HPI and PMH.    Physical Exam Updated Vital Signs BP (!) 142/82 (BP Location: Left Arm)   Pulse 71   Temp 98.2 F (36.8 C) (Oral)   Resp 18   Ht 6\' 5"  (1.956 m)   Wt 87.5 kg   SpO2 97%   BMI 22.89 kg/m   Physical Exam Vitals signs and nursing note reviewed.  Constitutional:      General: He is not in acute distress.    Appearance: He is well-developed.  HENT:     Head: Normocephalic and atraumatic.     Mouth/Throat:     Pharynx: No oropharyngeal exudate.  Eyes:     Conjunctiva/sclera: Conjunctivae normal.     Pupils: Pupils are equal, round, and reactive to light.  Neck:     Musculoskeletal: Normal range of motion and neck supple.     Comments: No meningismus. Cardiovascular:     Rate and Rhythm: Normal rate and regular rhythm.     Heart sounds: Normal heart sounds. No  murmur.  Pulmonary:     Effort: Pulmonary effort is normal. No respiratory distress.     Breath sounds: Normal breath sounds.  Abdominal:     Palpations: Abdomen is soft.     Tenderness: There is no abdominal tenderness. There is no guarding or rebound.  Musculoskeletal: Normal range of motion.        General: Tenderness and signs of injury present.     Comments: Left lateral joint tenderness of knee.  Flexion and extension are intact.  No appreciable ligament laxity.  Negative drawer sign.  Able to lift leg and keep knee extended.  Left wrist has no snuffbox tenderness or deformity  Skin:    General: Skin is warm.     Capillary Refill: Capillary refill takes less than 2 seconds.  Neurological:     Mental Status: He is alert and oriented to person, place, and time.      Cranial Nerves: No cranial nerve deficit.     Motor: No abnormal muscle tone.     Coordination: Coordination normal.     Comments: No ataxia on finger to nose bilaterally. No pronator drift. 5/5 strength throughout. CN 2-12 intact.Equal grip strength. Sensation intact.   Psychiatric:        Behavior: Behavior normal.      ED Treatments / Results  Labs (all labs ordered are listed, but only abnormal results are displayed) Labs Reviewed - No data to display  EKG None  Radiology Dg Knee Complete 4 Views Left  Result Date: 09/26/2019 CLINICAL DATA:  Fall.  Knee pain EXAM: LEFT KNEE - COMPLETE 4+ VIEW COMPARISON:  None. FINDINGS: No evidence of fracture, dislocation, or joint effusion. No evidence of arthropathy or other focal bone abnormality. Soft tissues are unremarkable. IMPRESSION: Negative. Electronically Signed   By: Kerby Moors M.D.   On: 09/26/2019 04:25    Procedures Procedures (including critical care time)  Medications Ordered in ED Medications  HYDROcodone-acetaminophen (NORCO/VICODIN) 5-325 MG per tablet 1 tablet (has no administration in time range)     Initial Impression / Assessment and Plan / ED Course  I have reviewed the triage vital signs and the nursing notes.  Pertinent labs & imaging results that were available during my care of the patient were reviewed by me and considered in my medical decision making (see chart for details).       Left knee pain after twisting it while rollerskating. Neurovascularly intact.  Declines left wrist x-ray  Knee x-ray is negative.  Discussed with patient probability of ligament strain, sprain or even tear.  He is given knee immobilizer, ice, elevation, NSAIDs, orthopedic follow-up.  Discussed will likely need MRI for further evaluation.  Return precautions discussed.  Final Clinical Impressions(s) / ED Diagnoses   Final diagnoses:  Acute pain of left knee    ED Discharge Orders    None        Norris Bodley, Annie Main, MD 09/26/19 [phone]

## 2019-09-26 NOTE — Discharge Instructions (Signed)
Your x-ray is negative.  As we discussed you could have some ligament damage.  Wear the knee brace and follow-up with the orthopedic doctor for an MRI of your knee.  Keep the leg elevated.  Apply ice and take anti-inflammatory medication.  Return to the ED if develop new or worsening symptoms.

## 2021-06-07 IMAGING — DX DG KNEE COMPLETE 4+V*L*
4 series · 4 of 4 positions shown · non-contrast
Comparison: None.

CLINICAL DATA: Fall.  Knee pain

EXAM:
LEFT KNEE - COMPLETE 4+ VIEW

[knee ap]
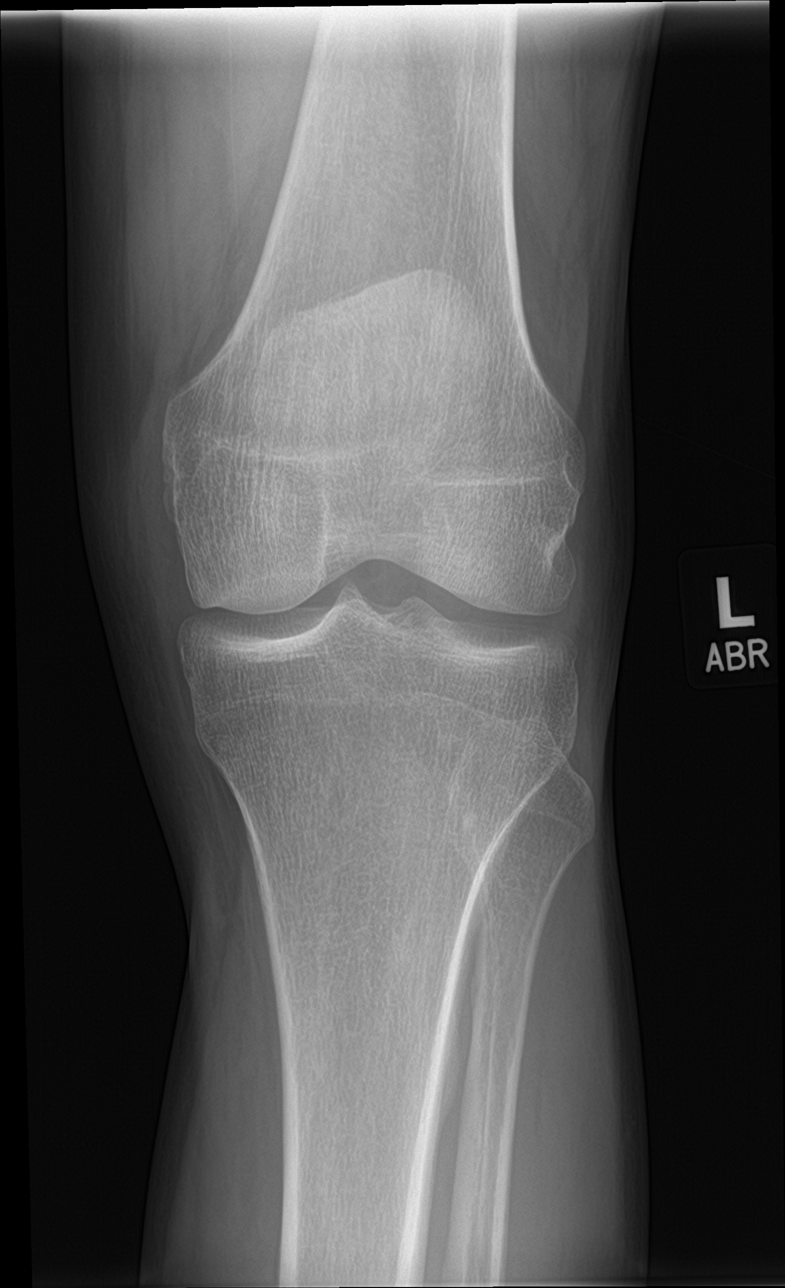

[knee obl (1 of 2)]
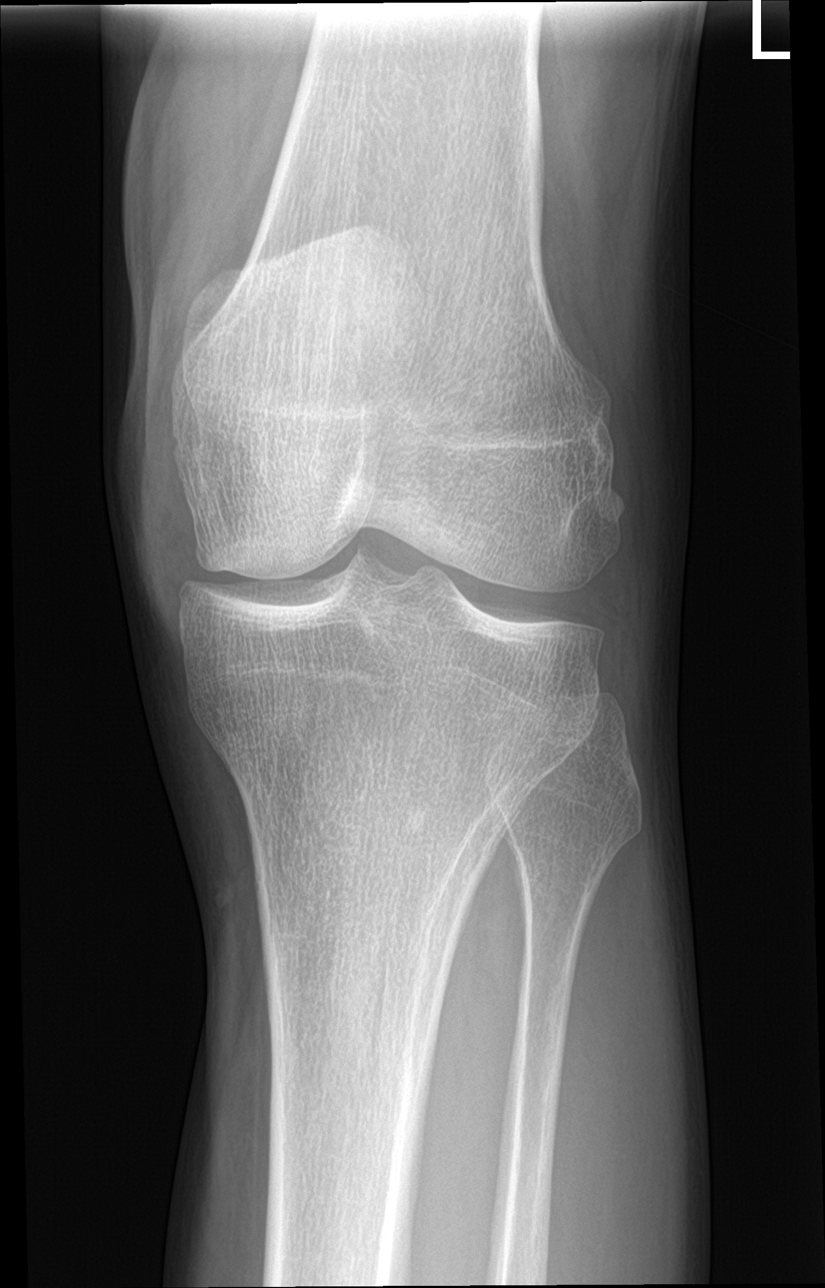

[knee obl (2 of 2)]
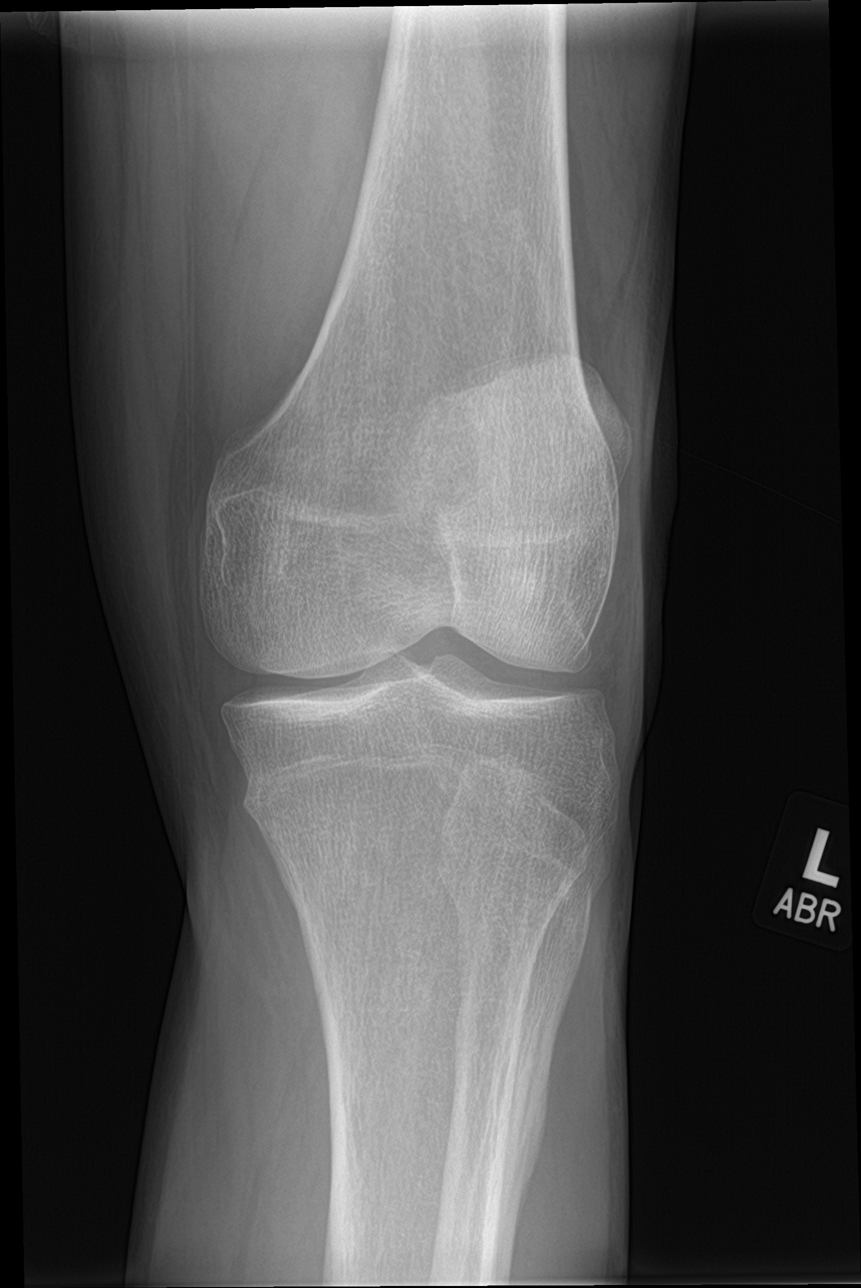

[knee lat]
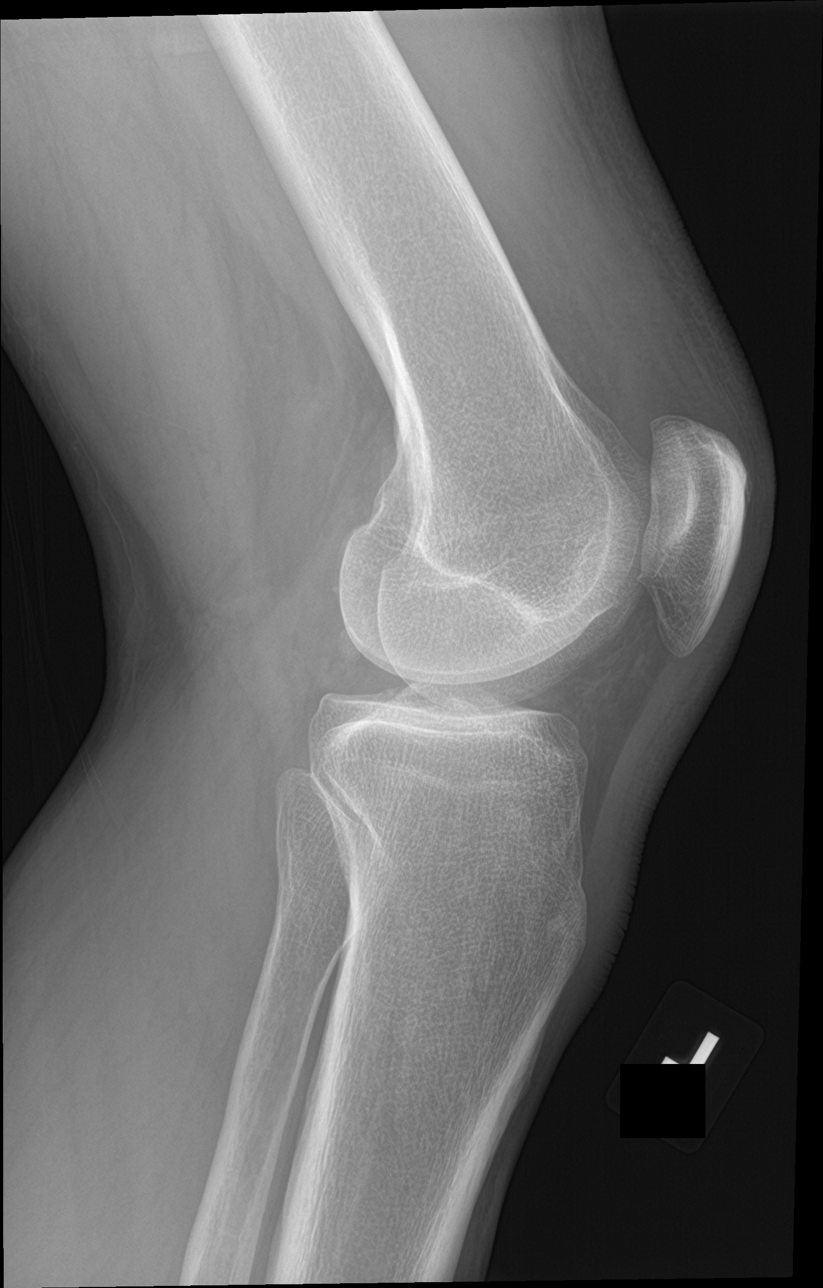

[4 of 4 positions shown; findings below may reference images not displayed]

FINDINGS: No evidence of fracture, dislocation, or joint effusion. No evidence
of arthropathy or other focal bone abnormality. Soft tissues are
unremarkable.
IMPRESSION: Negative.

## 2021-08-16 ENCOUNTER — Other Ambulatory Visit: Payer: Self-pay

## 2021-08-16 ENCOUNTER — Encounter (HOSPITAL_COMMUNITY): Payer: Self-pay | Admitting: *Deleted

## 2021-08-16 ENCOUNTER — Emergency Department (HOSPITAL_COMMUNITY)
Admission: EM | Admit: 2021-08-16 | Discharge: 2021-08-16 | Disposition: A | Discharge status: Home / Self Care | Payer: 59 | Attending: Emergency Medicine | Admitting: Emergency Medicine

## 2021-08-16 DIAGNOSIS — F17200 Nicotine dependence, unspecified, uncomplicated: Secondary | ICD-10-CM | POA: Insufficient documentation

## 2021-08-16 DIAGNOSIS — S59902A Unspecified injury of left elbow, initial encounter: Secondary | ICD-10-CM | POA: Diagnosis present

## 2021-08-16 DIAGNOSIS — W268XXA Contact with other sharp object(s), not elsewhere classified, initial encounter: Secondary | ICD-10-CM | POA: Diagnosis not present

## 2021-08-16 DIAGNOSIS — S51812A Laceration without foreign body of left forearm, initial encounter: Secondary | ICD-10-CM | POA: Diagnosis not present

## 2021-08-16 MED ORDER — LIDOCAINE HCL (PF) 2 % IJ SOLN
10.0000 mL | Freq: Once | INTRAMUSCULAR | Status: AC
  Administered 2021-08-16: 10 mL

## 2021-08-16 MED ORDER — LIDOCAINE HCL (PF) 2 % IJ SOLN
INTRAMUSCULAR | Status: AC
  Filled 2021-08-16: qty 20

## 2021-08-16 NOTE — ED Triage Notes (Signed)
Laceration to left wrist

## 2021-08-16 NOTE — ED Notes (Signed)
Left arm soaked in NS.

## 2021-08-16 NOTE — ED Provider Notes (Signed)
[facility] EMERGENCY DEPARTMENT Provider Note   CSN: [id number] Arrival date & time: 08/16/21  1026     History Chief Complaint  Patient presents with   Laceration    Dale Mason is a 55 y.o. male presenting for evaluation of left wrist laceration which occurred just prior to arrival.  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  He denies weakness or numbness distal to the injury site.  He states reported profusely but he has applied direct pressure bleeding has resolved.  He denies any other complaints of pain or injury.  He is current with his tetanus vaccine.  The history is provided by the patient.      Past Medical History:  Diagnosis Date   Seizures ([place])     There are no problems to display for this patient.   History reviewed. No pertinent surgical history.     No family history on file.  Social History   Tobacco Use   Smoking status: Every Day   Smokeless tobacco: Never  Substance Use Topics   Alcohol use: Yes    Comment: beer daily    Home Medications Prior to Admission medications   Medication Sig Start Date End Date Taking? Authorizing Provider  HYDROcodone-acetaminophen (NORCO/VICODIN) 5-325 MG tablet Take 1 tablet by mouth every 4 (four) hours as needed. 02/25/16   Evalee Jefferson, PA-C  naproxen (NAPROSYN) 500 MG tablet Take 1 tablet (500 mg total) by mouth 2 (two) times daily as needed. 09/26/19   Rancour, Annie Main, MD  phenytoin (DILANTIN) 100 MG ER capsule Take 200-300 mg by mouth 2 (two) times daily. Take 2 capsules in the morning and 3 capsules at night.    [provider]    Allergies    Patient has no known allergies.  Review of Systems   Review of Systems  Constitutional:  Negative for chills and fever.  Skin:  Positive for wound.  Neurological:  Negative for weakness and numbness.  All other systems reviewed and are negative.  Physical  Exam Updated Vital Signs BP (!) 143/89   Pulse 68   Temp 98.2 F (36.8 C)   Resp 20   Ht '6\' 4"'$  (1.93 m)   Wt 94 kg   SpO2 94%   BMI 25.23 kg/m   Physical Exam Constitutional:      Appearance: He is well-developed.  HENT:     Head: Normocephalic.  Cardiovascular:     Rate and Rhythm: Normal rate.  Pulmonary:     Effort: Pulmonary effort is normal.  Musculoskeletal:        General: No tenderness.     Comments: Patient displays full range of motion of his fingers and wrist of his left without weakness or deficit.  Skin:    Findings: Laceration present.     Comments: 2.5 cm laceration, curvilinear with well approximated edges, through the dermis, not completely through the subcu tissue.  Deeper structures are not visualized.  Wound is hemostatic and base of wound is clearly visualized.  Neurological:     Mental Status: He is alert and oriented to person, place, and time.     Sensory: No sensory deficit.    ED Results / Procedures / Treatments   Labs (all labs ordered are listed, but only abnormal results are displayed) Labs Reviewed - No data to display  EKG None  Radiology No results found.  Procedures Procedures  LACERATION REPAIR Performed by: Evalee Jefferson Authorized by: Evalee Jefferson Consent: Verbal consent obtained. Risks and benefits: risks, benefits and alternatives were discussed Consent given by: patient Patient identity confirmed: provided demographic data Prepped and Draped in normal sterile fashion Wound explored  Laceration Location: Left distal forearm  Laceration Length: 2.5 cm  No Foreign Bodies seen or palpated  Anesthesia: local infiltration  Local anesthetic: lidocaine 2% without epinephrine  Anesthetic total: 3 ml  Irrigation method: syringe Amount of cleaning: standard  Skin closure: Ethilon 4-0  Number of sutures: 7  Technique: Simple interrupted  Patient tolerance: Patient tolerated the procedure well with no immediate  complications.  Medications Ordered in ED Medications  lidocaine HCl (PF) (XYLOCAINE) 2 % injection (has no administration in time range)  lidocaine HCl (PF) (XYLOCAINE) 2 % injection 10 mL (10 mLs Other Given 08/16/21 1331)    ED Course  I have reviewed the triage vital signs and the nursing notes.  Pertinent labs & imaging results that were available during my care of the patient were reviewed by me and considered in my medical decision making (see chart for details).    MDM Rules/Calculators/A&P                           Wound care instructions given.  Pt advised to have sutures removed in 10 days,  Return here sooner for any signs of infection including redness, swelling, worse pain or drainage of pus.   Final Clinical Impression(s) / ED Diagnoses Final diagnoses:  Laceration of left forearm, initial encounter    Rx / DC Orders ED Discharge Orders     None        Landis Martins 08/16/21 1449    Isla Pence, MD 08/16/21 1511

## 2021-08-16 NOTE — Discharge Instructions (Signed)
Have your sutures removed in 10 days.  Keep your wound clean and dry,  Until a good scab forms - you may then wash gently twice daily with mild soap and water, but dry completely after.  Get rechecked for any sign of infection (redness,  Swelling,  Increased pain or drainage of purulent fluid). ° °

## 2021-11-25 ENCOUNTER — Encounter (HOSPITAL_COMMUNITY): Payer: Self-pay

## 2021-11-25 ENCOUNTER — Emergency Department (HOSPITAL_COMMUNITY)
Admission: EM | Admit: 2021-11-25 | Discharge: 2021-11-25 | Disposition: A | Discharge status: Home / Self Care | Payer: 59 | Attending: Emergency Medicine | Admitting: Emergency Medicine

## 2021-11-25 ENCOUNTER — Other Ambulatory Visit: Payer: Self-pay

## 2021-11-25 DIAGNOSIS — J209 Acute bronchitis, unspecified: Secondary | ICD-10-CM | POA: Insufficient documentation

## 2021-11-25 DIAGNOSIS — R0602 Shortness of breath: Secondary | ICD-10-CM | POA: Diagnosis present

## 2021-11-25 DIAGNOSIS — F17200 Nicotine dependence, unspecified, uncomplicated: Secondary | ICD-10-CM | POA: Diagnosis not present

## 2021-11-25 DIAGNOSIS — J40 Bronchitis, not specified as acute or chronic: Secondary | ICD-10-CM

## 2021-11-25 MED ORDER — ALBUTEROL SULFATE HFA 108 (90 BASE) MCG/ACT IN AERS
2.0000 | INHALATION_SPRAY | RESPIRATORY_TRACT | Status: DC | PRN
  Administered 2021-11-25: 03:00:00 2 via RESPIRATORY_TRACT
  Filled 2021-11-25: qty 6.7

## 2021-11-25 MED ORDER — AMOXICILLIN 500 MG PO CAPS: 500.0000 mg | ORAL_CAPSULE | Freq: Three times a day (TID) | ORAL | 0 refills | Status: DC

## 2021-11-25 MED ORDER — PREDNISONE 20 MG PO TABS: 40.0000 mg | ORAL_TABLET | Freq: Every day | ORAL | 0 refills | Status: DC

## 2021-11-25 NOTE — ED Triage Notes (Signed)
Pt here from home with c/o sob and cough x 4 days. Pt has not seen PCP. Pt says spouse had similar symptoms about a week ago and was dx with bronchitis.

## 2021-11-25 NOTE — ED Provider Notes (Signed)
[facility] EMERGENCY DEPARTMENT Provider Note   CSN: [id number] Arrival date & time: 11/25/21  9937     History Chief Complaint  Patient presents with   Shortness of Breath    Cough     Dale Mason is a 55 y.o. male.  Patient presents to the emergency department for cough, chest congestion, shortness of breath ongoing for days.  Patient reports that his wife recently had similar symptoms and was treated with an antibiotic for bronchitis.  He has taken multiple COVID tests that have been negative.      Past Medical History:  Diagnosis Date   Seizures [facility])     Patient Active Problem List   Diagnosis Date Noted   Hyperplastic rectal polyp 09/15/2017   Tubular adenoma of colon 09/15/2017    History reviewed. No pertinent surgical history.     History reviewed. No pertinent family history.  Social History   Tobacco Use   Smoking status: Every Day   Smokeless tobacco: Never  Substance Use Topics   Alcohol use: Yes    Comment: beer daily    Home Medications Prior to Admission medications   Medication Sig Start Date End Date Taking? Authorizing Provider  amoxicillin (AMOXIL) 500 MG capsule Take 1 capsule (500 mg total) by mouth 3 (three) times daily. 11/25/21  Yes Anivea Velasques, Gwenyth Allegra, MD  predniSONE (DELTASONE) 20 MG tablet Take 2 tablets (40 mg total) by mouth daily with breakfast. 11/25/21  Yes Delainie Chavana, Gwenyth Allegra, MD  HYDROcodone-acetaminophen (NORCO/VICODIN) 5-325 MG tablet Take 1 tablet by mouth every 4 (four) hours as needed. 02/25/16   Evalee Jefferson, PA-C  naproxen (NAPROSYN) 500 MG tablet Take 1 tablet (500 mg total) by mouth 2 (two) times daily as needed. 09/26/19   Rancour, Annie Main, MD  phenytoin (DILANTIN) 100 MG ER capsule Take 200-300 mg by mouth 2 (two) times daily. Take 2 capsules in the morning and 3 capsules at night.    [provider]  phenytoin (DILANTIN) 100 MG ER capsule Take by mouth.    [provider]    Allergies     Patient has no known allergies.  Review of Systems   Review of Systems  Constitutional:  Negative for fever.  Respiratory:  Positive for cough and shortness of breath.   All other systems reviewed and are negative.  Physical Exam Updated Vital Signs BP 133/78    Pulse 71    Temp 97.9 F (36.6 C)    Resp 18    Ht 6\' 4"  (1.93 m)    Wt 90.7 kg    SpO2 91%    BMI 24.34 kg/m   Physical Exam Vitals and nursing note reviewed.  Constitutional:      General: He is not in acute distress.    Appearance: Normal appearance. He is well-developed.  HENT:     Head: Normocephalic and atraumatic.     Right Ear: Hearing normal.     Left Ear: Hearing normal.     Nose: Nose normal.  Eyes:     Conjunctiva/sclera: Conjunctivae normal.     Pupils: Pupils are equal, round, and reactive to light.  Cardiovascular:     Rate and Rhythm: Regular rhythm.     Heart sounds: S1 normal and S2 normal. No murmur heard.   No friction rub. No gallop.  Pulmonary:     Effort: Pulmonary effort is normal. No respiratory distress.     Breath sounds: Normal breath sounds.  Chest:  Chest wall: No tenderness.  Abdominal:     General: Bowel sounds are normal.     Palpations: Abdomen is soft.     Tenderness: There is no abdominal tenderness. There is no guarding or rebound. Negative signs include Murphy's sign and McBurney's sign.     Hernia: No hernia is present.  Musculoskeletal:        General: Normal range of motion.     Cervical back: Normal range of motion and neck supple.  Skin:    General: Skin is warm and dry.     Findings: No rash.  Neurological:     Mental Status: He is alert and oriented to person, place, and time.     GCS: GCS eye subscore is 4. GCS verbal subscore is 5. GCS motor subscore is 6.     Cranial Nerves: No cranial nerve deficit.     Sensory: No sensory deficit.     Coordination: Coordination normal.  Psychiatric:        Speech: Speech normal.        Behavior: Behavior normal.         Thought Content: Thought content normal.    ED Results / Procedures / Treatments   Labs (all labs ordered are listed, but only abnormal results are displayed) Labs Reviewed - No data to display  EKG None  Radiology No results found.  Procedures Procedures   Medications (in ED Medications  albuterol (VENTOLIN HFA) 108 (90 Base) MCG/ACT inhaler 2 puff (2 puffs Inhalation Given 11/25/21 0301)    ED Course  I have reviewed the triage vital signs and the nursing notes.  Pertinent labs & imaging results that were available during my care of the patient were reviewed by me and considered in my medical decision making (see chart for details).    MDM Rules/Calculators/A&P                           Patient presents with cough and congestion for 4 days.  He has a sick contact at home.  I do not see any clinical signs of pneumonia.  No active bronchospasm at this time.  We will treat empirically for bronchitis.  Final Clinical Impression(s) / ED Diagnoses Final diagnoses:  Bronchitis    Rx / DC Orders ED Discharge Orders          Ordered    amoxicillin (AMOXIL) 500 MG capsule  3 times daily        11/25/21 0257    predniSONE (DELTASONE) 20 MG tablet  Daily with breakfast        11/25/21 0257             Orpah Greek, MD 11/25/21 [phone]

## 2023-08-13 ENCOUNTER — Ambulatory Visit
Admission: RE | Admit: 2023-08-13 | Discharge: 2023-08-13 | Disposition: A | Discharge status: Home / Self Care | Payer: 59 | Source: Ambulatory Visit | Attending: Family Medicine | Admitting: Family Medicine

## 2023-08-13 VITALS — BP 134/79 | HR 89 | Temp 97.7°F | Resp 14

## 2023-08-13 DIAGNOSIS — J0100 Acute maxillary sinusitis, unspecified: Secondary | ICD-10-CM | POA: Diagnosis not present

## 2023-08-13 DIAGNOSIS — R062 Wheezing: Secondary | ICD-10-CM

## 2023-08-13 MED ORDER — AMOXICILLIN-POT CLAVULANATE 875-125 MG PO TABS: 1.0000 | ORAL_TABLET | Freq: Two times a day (BID) | ORAL | 0 refills | Status: DC

## 2023-08-13 MED ORDER — PREDNISONE 20 MG PO TABS: 40.0000 mg | ORAL_TABLET | Freq: Every day | ORAL | 0 refills | Status: DC

## 2023-08-13 NOTE — ED Provider Notes (Signed)
[facility]    CSN: [id number] Arrival date & time: 08/13/23  1435      History   Chief Complaint Chief Complaint  Patient presents with   Nasal Congestion    Headache from possible infection.Covid positive as of 8/29.No fever - Entered by patient    HPI Dale Mason is a 57 y.o. male.   Patient presenting today with sinus pain and pressure, nasal congestion, sinus headache, hacking productive cough and chest tightness 2 weeks after COVID-19 diagnosis.  Had a fever early on with COVID but has not had 1 since.  Denies chest pain, significant shortness of breath, abdominal pain, nausea vomiting or diarrhea.  States Sudafed helps with the sinus pressure for a short period of time.  Denies history of chronic pulmonary disease.    Past Medical History:  Diagnosis Date   Seizures [facility])     Patient Active Problem List   Diagnosis Date Noted   Hyperplastic rectal polyp 09/15/2017   Tubular adenoma of colon 09/15/2017    History reviewed. No pertinent surgical history.     Home Medications    Prior to Admission medications   Medication Sig Start Date End Date Taking? Authorizing Provider  amoxicillin-clavulanate (AUGMENTIN) 875-125 MG tablet Take 1 tablet by mouth every 12 (twelve) hours. 08/13/23  Yes Particia Nearing, PA-C  predniSONE (DELTASONE) 20 MG tablet Take 2 tablets (40 mg total) by mouth daily with breakfast. 08/13/23  Yes Particia Nearing, PA-C  amoxicillin (AMOXIL) 500 MG capsule Take 1 capsule (500 mg total) by mouth 3 (three) times daily. 11/25/21   Gilda Crease, MD  HYDROcodone-acetaminophen (NORCO/VICODIN) 5-325 MG tablet Take 1 tablet by mouth every 4 (four) hours as needed. 02/25/16   Burgess Amor, PA-C  naproxen (NAPROSYN) 500 MG tablet Take 1 tablet (500 mg total) by mouth 2 (two) times daily as needed. 09/26/19   Rancour, Jeannett Senior, MD  phenytoin (DILANTIN) 100 MG ER capsule Take 200-300 mg by mouth 2 (two) times daily. Take 2  capsules in the morning and 3 capsules at night.    [provider]  phenytoin (DILANTIN) 100 MG ER capsule Take by mouth.    [provider]  predniSONE (DELTASONE) 20 MG tablet Take 2 tablets (40 mg total) by mouth daily with breakfast. 11/25/21   Pollina, Canary Brim, MD    Family History History reviewed. No pertinent family history.  Social History Social History   Tobacco Use   Smoking status: Every Day   Smokeless tobacco: Never  Substance Use Topics   Alcohol use: Yes    Comment: beer daily     Allergies   Patient has no known allergies.   Review of Systems Review of Systems PER HPI  Physical Exam Triage Vital Signs ED Triage Vitals  Encounter Vitals Group     BP 08/13/23 1439 134/79     Systolic BP Percentile --      Diastolic BP Percentile --      Pulse --      Resp 08/13/23 1439 14     Temp 08/13/23 1439 97.7 F (36.5 C)     Temp Source 08/13/23 1439 Oral     SpO2 --      Weight --      Height --      Head Circumference --      Peak Flow --      Pain Score 08/13/23 1441 3     Pain Loc --  Pain Education --      Exclude from Growth Chart --    No data found.  Updated Vital Signs BP 134/79 (BP Location: Right Arm)   Temp 97.7 F (36.5 C) (Oral)   Resp 14   Visual Acuity Right Eye Distance:   Left Eye Distance:   Bilateral Distance:    Right Eye Near:   Left Eye Near:    Bilateral Near:     Physical Exam Vitals and nursing note reviewed.  Constitutional:      Appearance: He is well-developed.  HENT:     Head: Atraumatic.     Right Ear: External ear normal.     Left Ear: External ear normal.     Nose: Congestion present.     Mouth/Throat:     Pharynx: Posterior oropharyngeal erythema present. No oropharyngeal exudate.  Eyes:     Conjunctiva/sclera: Conjunctivae normal.     Pupils: Pupils are equal, round, and reactive to light.  Cardiovascular:     Rate and Rhythm: Normal rate and regular rhythm.   Pulmonary:     Effort: Pulmonary effort is normal. No respiratory distress.     Breath sounds: Wheezing present. No rales.  Musculoskeletal:        General: Normal range of motion.     Cervical back: Normal range of motion and neck supple.  Lymphadenopathy:     Cervical: No cervical adenopathy.  Skin:    General: Skin is warm and dry.  Neurological:     Mental Status: He is alert and oriented to person, place, and time.  Psychiatric:        Behavior: Behavior normal.      UC Treatments / Results  Labs (all labs ordered are listed, but only abnormal results are displayed) Labs Reviewed - No data to display  EKG   Radiology No results found.  Procedures Procedures (including critical care time)  Medications Ordered in UC Medications - No data to display  Initial Impression / Assessment and Plan / UC Course  I have reviewed the triage vital signs and the nursing notes.  Pertinent labs & imaging results that were available during my care of the patient were reviewed by me and considered in my medical decision making (see chart for details).     Vitals and exam overall reassuring, given duration and worsening course will treat for sinusitis with Augmentin and prednisone for wheezing, chest tightness and cough.  Discussed supportive over-the-counter medications, home care.  Return for worsening symptoms.  Final Clinical Impressions(s) / UC Diagnoses   Final diagnoses:  Acute non-recurrent maxillary sinusitis  Wheezing   Discharge Instructions   None    ED Prescriptions     Medication Sig Dispense Auth. Provider   predniSONE (DELTASONE) 20 MG tablet Take 2 tablets (40 mg total) by mouth daily with breakfast. 10 tablet Particia Nearing, PA-C   amoxicillin-clavulanate (AUGMENTIN) 875-125 MG tablet Take 1 tablet by mouth every 12 (twelve) hours. 14 tablet Particia Nearing, [place]      PDMP not reviewed this encounter.   Particia Nearing,  [place] 08/13/23 1525

## 2023-08-13 NOTE — ED Triage Notes (Signed)
Pt c/o headache and cough, pt was COVID positive on 07/31/2023, this started post COVID, chest congestion, and stabbing pain in the head.

## 2024-07-04 ENCOUNTER — Inpatient Hospital Stay (HOSPITAL_COMMUNITY): Admission: EM | Admit: 2024-07-04 | Discharge: 2024-08-02 | DRG: 917 | Disposition: E

## 2024-07-04 ENCOUNTER — Emergency Department (HOSPITAL_COMMUNITY)

## 2024-07-04 DIAGNOSIS — E875 Hyperkalemia: Secondary | ICD-10-CM | POA: Diagnosis not present

## 2024-07-04 DIAGNOSIS — I468 Cardiac arrest due to other underlying condition: Secondary | ICD-10-CM | POA: Diagnosis not present

## 2024-07-04 DIAGNOSIS — Z87828 Personal history of other (healed) physical injury and trauma: Secondary | ICD-10-CM | POA: Diagnosis not present

## 2024-07-04 DIAGNOSIS — G40901 Epilepsy, unspecified, not intractable, with status epilepticus: Secondary | ICD-10-CM | POA: Diagnosis present

## 2024-07-04 DIAGNOSIS — R7401 Elevation of levels of liver transaminase levels: Secondary | ICD-10-CM

## 2024-07-04 DIAGNOSIS — R579 Shock, unspecified: Secondary | ICD-10-CM | POA: Diagnosis not present

## 2024-07-04 DIAGNOSIS — R4182 Altered mental status, unspecified: Secondary | ICD-10-CM | POA: Diagnosis not present

## 2024-07-04 DIAGNOSIS — G928 Other toxic encephalopathy: Secondary | ICD-10-CM | POA: Diagnosis present

## 2024-07-04 DIAGNOSIS — J189 Pneumonia, unspecified organism: Secondary | ICD-10-CM | POA: Diagnosis not present

## 2024-07-04 DIAGNOSIS — R739 Hyperglycemia, unspecified: Secondary | ICD-10-CM | POA: Diagnosis present

## 2024-07-04 DIAGNOSIS — R569 Unspecified convulsions: Secondary | ICD-10-CM | POA: Diagnosis not present

## 2024-07-04 DIAGNOSIS — I214 Non-ST elevation (NSTEMI) myocardial infarction: Secondary | ICD-10-CM | POA: Diagnosis not present

## 2024-07-04 DIAGNOSIS — J439 Emphysema, unspecified: Secondary | ICD-10-CM | POA: Diagnosis present

## 2024-07-04 DIAGNOSIS — I462 Cardiac arrest due to underlying cardiac condition: Secondary | ICD-10-CM | POA: Diagnosis present

## 2024-07-04 DIAGNOSIS — K7200 Acute and subacute hepatic failure without coma: Secondary | ICD-10-CM | POA: Diagnosis present

## 2024-07-04 DIAGNOSIS — S069X0A Unspecified intracranial injury without loss of consciousness, initial encounter: Secondary | ICD-10-CM | POA: Diagnosis not present

## 2024-07-04 DIAGNOSIS — E8729 Other acidosis: Secondary | ICD-10-CM | POA: Diagnosis present

## 2024-07-04 DIAGNOSIS — Z79899 Other long term (current) drug therapy: Secondary | ICD-10-CM

## 2024-07-04 DIAGNOSIS — E8721 Acute metabolic acidosis: Secondary | ICD-10-CM | POA: Diagnosis not present

## 2024-07-04 DIAGNOSIS — Z7401 Bed confinement status: Secondary | ICD-10-CM | POA: Diagnosis not present

## 2024-07-04 DIAGNOSIS — J9601 Acute respiratory failure with hypoxia: Secondary | ICD-10-CM | POA: Diagnosis present

## 2024-07-04 DIAGNOSIS — G931 Anoxic brain damage, not elsewhere classified: Secondary | ICD-10-CM | POA: Diagnosis present

## 2024-07-04 DIAGNOSIS — J939 Pneumothorax, unspecified: Secondary | ICD-10-CM | POA: Diagnosis not present

## 2024-07-04 DIAGNOSIS — N179 Acute kidney failure, unspecified: Secondary | ICD-10-CM | POA: Diagnosis present

## 2024-07-04 DIAGNOSIS — T782XXA Anaphylactic shock, unspecified, initial encounter: Secondary | ICD-10-CM | POA: Diagnosis present

## 2024-07-04 DIAGNOSIS — I469 Cardiac arrest, cause unspecified: Secondary | ICD-10-CM | POA: Diagnosis not present

## 2024-07-04 DIAGNOSIS — T63461A Toxic effect of venom of wasps, accidental (unintentional), initial encounter: Secondary | ICD-10-CM | POA: Diagnosis present

## 2024-07-04 DIAGNOSIS — I959 Hypotension, unspecified: Secondary | ICD-10-CM | POA: Diagnosis not present

## 2024-07-04 DIAGNOSIS — Z529 Donor of unspecified organ or tissue: Secondary | ICD-10-CM | POA: Diagnosis not present

## 2024-07-04 DIAGNOSIS — R0602 Shortness of breath: Secondary | ICD-10-CM | POA: Diagnosis not present

## 2024-07-04 DIAGNOSIS — I21A1 Myocardial infarction type 2: Secondary | ICD-10-CM | POA: Diagnosis not present

## 2024-07-04 DIAGNOSIS — R0689 Other abnormalities of breathing: Secondary | ICD-10-CM | POA: Diagnosis not present

## 2024-07-04 DIAGNOSIS — I2699 Other pulmonary embolism without acute cor pulmonale: Secondary | ICD-10-CM | POA: Diagnosis not present

## 2024-07-04 DIAGNOSIS — R410 Disorientation, unspecified: Secondary | ICD-10-CM | POA: Diagnosis not present

## 2024-07-04 DIAGNOSIS — F17200 Nicotine dependence, unspecified, uncomplicated: Secondary | ICD-10-CM | POA: Diagnosis present

## 2024-07-04 LAB — BLOOD GAS, ARTERIAL
Acid-base deficit: 14.7 mmol/L — ABNORMAL HIGH (ref 0.0–2.0)
Bicarbonate: 17 mmol/L — ABNORMAL LOW (ref 20.0–28.0)
Drawn by: 10555
O2 Saturation: 99.8 %
Patient temperature: 35.3
pCO2 arterial: 61 mmHg — ABNORMAL HIGH (ref 32–48)
pH, Arterial: 7.04 — CL (ref 7.35–7.45)
pO2, Arterial: 432 mmHg — ABNORMAL HIGH (ref 83–108)

## 2024-07-04 LAB — COMPREHENSIVE METABOLIC PANEL WITH GFR
ALT: 80 U/L — ABNORMAL HIGH (ref 0–44)
AST: 90 U/L — ABNORMAL HIGH (ref 15–41)
Albumin: 3.3 g/dL — ABNORMAL LOW (ref 3.5–5.0)
Alkaline Phosphatase: 124 U/L (ref 38–126)
Anion gap: 18 — ABNORMAL HIGH (ref 5–15)
BUN: 13 mg/dL (ref 6–20)
CO2: 18 mmol/L — ABNORMAL LOW (ref 22–32)
Calcium: 8.4 mg/dL — ABNORMAL LOW (ref 8.9–10.3)
Chloride: 98 mmol/L (ref 98–111)
Creatinine, Ser: 1.33 mg/dL — ABNORMAL HIGH (ref 0.61–1.24)
GFR, Estimated: >60 mL/min
Glucose, Bld: 306 mg/dL — ABNORMAL HIGH (ref 70–99)
Potassium: 3.5 mmol/L (ref 3.5–5.1)
Sodium: 134 mmol/L — ABNORMAL LOW (ref 135–145)
Total Bilirubin: 0.7 mg/dL (ref 0.0–1.2)
Total Protein: 5.6 g/dL — ABNORMAL LOW (ref 6.5–8.1)

## 2024-07-04 LAB — CBC WITH DIFFERENTIAL/PLATELET
Abs Immature Granulocytes: 0.3 K/uL — ABNORMAL HIGH (ref 0.00–0.07)
Basophils Absolute: 0 K/uL (ref 0.0–0.1)
Basophils Relative: 0 %
Eosinophils Absolute: 0.1 K/uL (ref 0.0–0.5)
Eosinophils Relative: 1 %
HCT: 48.8 % (ref 39.0–52.0)
Hemoglobin: 16 g/dL (ref 13.0–17.0)
Immature Granulocytes: 3 %
Lymphocytes Relative: 67 %
Lymphs Abs: 6.1 K/uL — ABNORMAL HIGH (ref 0.7–4.0)
MCH: 32 pg (ref 26.0–34.0)
MCHC: 32.8 g/dL (ref 30.0–36.0)
MCV: 97.6 fL (ref 80.0–100.0)
Monocytes Absolute: 0.2 K/uL (ref 0.1–1.0)
Monocytes Relative: 2 %
Neutro Abs: 2.5 K/uL (ref 1.7–7.7)
Neutrophils Relative %: 27 %
Platelets: 305 K/uL (ref 150–400)
RBC: 5 MIL/uL (ref 4.22–5.81)
RDW: 12.6 % (ref 11.5–15.5)
WBC: 9.4 K/uL (ref 4.0–10.5)
nRBC: 0.6 % — ABNORMAL HIGH (ref 0.0–0.2)

## 2024-07-04 LAB — TROPONIN I (HIGH SENSITIVITY): Troponin I (High Sensitivity): 15 ng/L (ref <18)

## 2024-07-04 LAB — PHENYTOIN LEVEL, TOTAL: Phenytoin Lvl: 13.2 ug/mL (ref 10.0–20.0)

## 2024-07-04 LAB — LACTIC ACID, PLASMA: Lactic Acid, Venous: 8.4 mmol/L (ref 0.5–1.9)

## 2024-07-04 MED ORDER — FAMOTIDINE IN NACL 20-0.9 MG/50ML-% IV SOLN
INTRAVENOUS | Status: AC
  Administered 2024-07-04: 20 mg via INTRAVENOUS
  Filled 2024-07-04: qty 50

## 2024-07-04 MED ORDER — SODIUM CHLORIDE 0.9 % IV BOLUS
1000.0000 mL | Freq: Once | INTRAVENOUS | Status: AC
  Administered 2024-07-04: 1000 mL via INTRAVENOUS

## 2024-07-04 MED ORDER — ORAL CARE MOUTH RINSE
15.0000 mL | OROMUCOSAL | Status: DC | PRN
Start: 2024-07-04 — End: 2024-07-11

## 2024-07-04 MED ORDER — METHYLPREDNISOLONE SODIUM SUCC 125 MG IJ SOLR
INTRAMUSCULAR | Status: AC
  Administered 2024-07-04: 125 mg
  Filled 2024-07-04: qty 2

## 2024-07-04 MED ORDER — LORAZEPAM 2 MG/ML IJ SOLN
2.0000 mg | Freq: Once | INTRAMUSCULAR | Status: AC
  Administered 2024-07-04: 2 mg via INTRAVENOUS
  Filled 2024-07-04: qty 1

## 2024-07-04 MED ORDER — EPINEPHRINE HCL 5 MG/250ML IV SOLN IN NS
INTRAVENOUS | Status: AC
  Administered 2024-07-04: 0.5 ug/min via INTRAVENOUS
  Filled 2024-07-04: qty 250

## 2024-07-04 MED ORDER — NOREPINEPHRINE 4 MG/250ML-% IV SOLN
INTRAVENOUS | Status: AC
  Filled 2024-07-04: qty 250

## 2024-07-04 MED ORDER — EPINEPHRINE PF 1 MG/ML IJ SOLN
1.0000 mg | Freq: Once | INTRAMUSCULAR | Status: AC
  Administered 2024-07-04: 1 mg via INTRAVENOUS

## 2024-07-04 MED ORDER — CHLORHEXIDINE GLUCONATE CLOTH 2 % EX PADS
6.0000 | MEDICATED_PAD | Freq: Every day | CUTANEOUS | Status: DC
  Administered 2024-07-05 – 2024-07-10 (×7): 6 via TOPICAL

## 2024-07-04 MED ORDER — ORAL CARE MOUTH RINSE
15.0000 mL | OROMUCOSAL | Status: DC
  Administered 2024-07-05 – 2024-07-10 (×65): 15 mL via OROMUCOSAL

## 2024-07-04 MED ORDER — EPINEPHRINE HCL 5 MG/250ML IV SOLN IN NS
0.5000 ug/min | INTRAVENOUS | Status: DC
  Administered 2024-07-05: 9 ug/min via INTRAVENOUS
  Filled 2024-07-04: qty 250

## 2024-07-04 MED ORDER — PROPOFOL 1000 MG/100ML IV EMUL
INTRAVENOUS | Status: AC
  Administered 2024-07-04: 10 ug/kg/min via INTRAVENOUS
  Filled 2024-07-04: qty 100

## 2024-07-04 MED ORDER — NOREPINEPHRINE 4 MG/250ML-% IV SOLN
0.0000 ug/min | INTRAVENOUS | Status: DC
  Administered 2024-07-05: 9 ug/min via INTRAVENOUS
  Administered 2024-07-05 (×2): 20 ug/min via INTRAVENOUS
  Administered 2024-07-05: 19 ug/min via INTRAVENOUS
  Administered 2024-07-06: 7 ug/min via INTRAVENOUS
  Administered 2024-07-06: 16 ug/min via INTRAVENOUS
  Filled 2024-07-04 (×6): qty 250

## 2024-07-04 MED ORDER — FENTANYL 2500MCG IN NS 250ML (10MCG/ML) PREMIX INFUSION
0.0000 ug/h | INTRAVENOUS | Status: DC
  Administered 2024-07-04 (×2): 25 ug/h via INTRAVENOUS
  Administered 2024-07-04: 50 ug/h via INTRAVENOUS
  Administered 2024-07-06: 75 ug/h via INTRAVENOUS
  Administered 2024-07-08: 25 ug/h via INTRAVENOUS
  Filled 2024-07-04 (×3): qty 250

## 2024-07-04 MED ORDER — FAMOTIDINE IN NACL 20-0.9 MG/50ML-% IV SOLN: 20.0000 mg | Freq: Once | INTRAVENOUS | Status: AC

## 2024-07-04 MED ORDER — EPINEPHRINE 1 MG/10ML IJ SOSY
1.0000 mg | PREFILLED_SYRINGE | Freq: Once | INTRAMUSCULAR | Status: AC
  Administered 2024-07-04: 1 mg via INTRAVENOUS

## 2024-07-04 MED ORDER — ETOMIDATE 2 MG/ML IV SOLN
INTRAVENOUS | Status: AC
  Administered 2024-07-04: 20 mg
  Filled 2024-07-04: qty 20

## 2024-07-04 MED ORDER — ROCURONIUM BROMIDE 10 MG/ML (PF) SYRINGE
PREFILLED_SYRINGE | INTRAVENOUS | Status: AC
  Administered 2024-07-04: 100 mg
  Filled 2024-07-04: qty 10

## 2024-07-04 MED ORDER — PROPOFOL 1000 MG/100ML IV EMUL
0.0000 ug/kg/min | INTRAVENOUS | Status: DC
  Administered 2024-07-05 (×4): 70 ug/kg/min via INTRAVENOUS
  Administered 2024-07-05 (×5): 100 ug/kg/min via INTRAVENOUS
  Administered 2024-07-06: 20 ug/kg/min via INTRAVENOUS
  Administered 2024-07-06: 70 ug/kg/min via INTRAVENOUS
  Administered 2024-07-06: 50 ug/kg/min via INTRAVENOUS
  Administered 2024-07-06: 70 ug/kg/min via INTRAVENOUS
  Administered 2024-07-06: 20 ug/kg/min via INTRAVENOUS
  Administered 2024-07-06: 70 ug/kg/min via INTRAVENOUS
  Administered 2024-07-07: 20 ug/kg/min via INTRAVENOUS
  Filled 2024-07-04 (×17): qty 100

## 2024-07-04 MED ORDER — DIPHENHYDRAMINE HCL 50 MG/ML IJ SOLN
INTRAMUSCULAR | Status: AC
  Administered 2024-07-04: 25 mg
  Filled 2024-07-04: qty 1

## 2024-07-04 NOTE — ED Notes (Addendum)
 Report given to [name] of [name]

## 2024-07-04 NOTE — ED Triage Notes (Addendum)
 Pt comes by EMS for unresponsiveness form possible allergic rxn. Pt was stung by a wasp on the top of his head. Pt does not have any allergies. Pt was found unresponsive by EMS w/ agonal breathing & bradycardiac. EMS intubated pt by king tube. On route pt became asystole and CPR was started. Pt was given 0.5 Epi IM and 1mg  Epi IV.

## 2024-07-04 NOTE — ED Notes (Signed)
 Bolus of propofol  given, total of 10mg /ml

## 2024-07-04 NOTE — Progress Notes (Signed)
 eLink Physician-Brief Progress Note Patient Name: Dale Mason DOB: 1966-08-14 MRN: [id number]   Date of Service  07/04/2024  HPI/Events of Note     [facility]; Pt comes by EMS for unresponsiveness form possible allergic rxn. Pt was stung by a wasp on the top of his head. Pt does not have any allergies. Pt was found unresponsive by EMS w/ agonal breathing & bradycardiac. EMS intubated pt by king tube. On route pt became asystole and CPR was started. Pt was given 0.5 Epi IM and 1mg  Epi IV.   Now in ICU.  Camera; Discussed with RN. Has leg twitching, eye rolling up. Consistent with possible seizure activity. Could have some myoclonic twitching on top of it. In synchrony with Vent 690 ( tall guy)/5/24. Sast 100%. HR 72. 110/68 Off of levo. On levophed  at 9,  propofol , fentanyl .   Stat ativan  2 mg ordered.  Ground team is aware of new admit for further evaluation.   Data: Reviewed CxR no pneumonia or CHF, emphysema, bullae in right upper zone. ET in Place EKG qtc prolongation, sinus tachycardia. Anterolateral ST depression. Wbc normal. No anemia Elevated LA.   Anaphylaxis shock. WASP sting.  Cardiogenic. Not having any sepsis infection focus.       eICU Interventions  - troponin, LA follow through ordered. Ordered AM labs Ativan  stat Sz precautions Get CT head once stable. Glucose goals < 180 VTE prophylaxis, VAP bundle  Tox screening Keep K/Mag > 4/2 respectively. ECHO ordered       Intervention Category Major Interventions: Shock - evaluation and management;Seizures - evaluation and management Evaluation Type: New Patient Evaluation  Dale Mason 07/04/2024, 11:55 PM  01:51 Lactic 2.3 is improving, worsening Trop 406 from cardiogenic shock. Keep trending troponin levels for now. Get EKG for any acute ST changes.  ABG reviewed. NP at bed side.  2:15 CTH neg Seizure, NP ordering EEG, neuro input. Seizure meds.   03:31 trop's came back as 406 from MN,  stabilizing. Trend once more in AM. EKG normal sinus, no acute changes. Qtc is 430. Neurology notes reviewed. Follow recommendations  04:16  F/U ABG's in epic, done on 100%  FIO2 still, when they got back from CT,  Ionized Calcium 1.06,    improving resp acidosis, with pH 7.30. wean down fio2 as tolerated.   asking for CBG orders/SSI with pt on steroids, current CBG 261- not crossing over as of yet Ssi, poc ordered q4 hrly

## 2024-07-04 NOTE — ED Notes (Signed)
 20

## 2024-07-04 NOTE — Procedures (Signed)
 RT Art Line Insertion  Date/Time: 07/04/2024 9:12 PM  Performed by: Claudene Dawna RAMAN, RRT Authorized by: Cleotilde Rogue, MD  Consent: The procedure was performed in an emergent situation. Verbal consent not obtained. Written consent not obtained Risks and benefits: risks, benefits and alternatives were discussed Patient understanding: patient does not state understanding of the procedure being performed Patient consent: the patient's understanding of the procedure does not match consent given Procedure consent: procedure consent does not match procedure scheduled Relevant documents: relevant documents not present or verified Test results: test results available and properly labeled Site marked: the operative site was marked Imaging studies: imaging studies not available Patient identity confirmed: arm band, provided demographic data and hospital-assigned identification number Time out: Immediately prior to procedure a time out was called to verify the correct patient, procedure, equipment, support staff and site/side marked as required. Preparation: Patient was prepped and draped in the usual sterile fashion. Local anesthesia used: no  Anesthesia: Local anesthesia used: no  Sedation: Patient sedated: no  Patient tolerance: patient tolerated the procedure well with no immediate complications

## 2024-07-04 NOTE — ED Notes (Signed)
[facility] CALLED FOR TRANSPORT °

## 2024-07-04 NOTE — H&P (Signed)
 NAME:  Dale Mason, MRN:  [id number], DOB:  March 04, 1966, LOS: 0 ADMISSION DATE:  07/04/2024, CONSULTATION DATE:  07/04/24 REFERRING MD:  EDP, CHIEF COMPLAINT:  anaphylaxis   History of Present Illness:  58 yo male working outside today when he sustained multiple wasp stings. Pt has sustained wasp stings previously without issue. However after being stung for a second time on his head this evening he was having shortness of breath, feeling of weakness with difficulty walking and wobbling with near falls. EMS was called and upon arrival he was found unresponsive, diaphoretic with agonal breathing and bradycardia, no palpable pulse and ALCS was started, airway established in the field. Pt has multiple rounds of epi between home and hospital pea and asystole at pulse checks, ROSC ultimately achieved and pt was started in epi infusion. EDP states he is adding levophed  at this time as well.   Cvc and arterial line were placed. Labs revealed acidosis, transaminitis and aki. CCM was asked for admission in light of the above.   All history is obtained from chart review, ROS unobtainable 2/2 intubated status.   Upon arrival to [facility] ICU, patient noted to have myoclonic jerking/seizure activity. STAT Ativan  2mg  given ordered by Ema CCM MD Mohon CT-scan of head obtained- no intracranial abnormality noted. Patient's most recent ABG- PH 7.138, Pco2  Update: pt began having seizure like activity. Last sz noted per wife was 2007. He is diligent about his medications and has not missed a dose of dilantin . Eeg and neuro consult ordered. Dilantin  level sent.   Pertinent  Medical History  Sz d/o  Significant Hospital Events: Including procedures, antibiotic start and stop dates in addition to other pertinent events   Presented to [facility] with anaphylactic shock 8/3 Transferred to [facility] for admission to ICU 8/3  Interim History / Subjective:   Objective    Blood pressure (!) 77/53, resp. rate (!) 8.       No intake  or output data in the 24 hours ending 07/04/24 2114 There were no vitals filed for this visit.  Examination: General: unresponsive on vent, seizure like activity ongoing HENT: ncat, eyelids rhythmic fluttering, mm dry and pale Lungs: coarse bilaterally Cardiovascular: rrr Abdomen: soft, nd bs+ Extremities: no c/c/e Neuro: unresponsive, rhythmic jerking of legs and fluttering of eye lids. Not following commands.  GU: deferred.   Resolved problem list   Assessment and Plan  Anaphylactic shock Acute hypoxic resp failure req mechanical ventilation Cardiac arrest 2/2 above Cardiogenic shock 2/2 above Metabolic acidosis Aki Transaminitis, 2/2 shock liver H/o sz disorder with current sz like activity Elevated ck -titrate vasopressors and inotropes -s/p cvc and aline -echo in am -titrate vent, sat/sbt when able -diphenhydramine , h2 blocker, steroid -trend I/o, uop, indices -trend lft's  -3 amp bicarb -check magnesium  -check dilantin  level -neuro to see -eeg ordered -given ativan , working towards burst suppression with sedation. Will increase pressors as needed -recheck ck in am   Best Practice (right click and Reselect all SmartList Selections daily)   Diet/type: NPO DVT prophylaxis prophylactic heparin   Pressure ulcer(s): N/A GI prophylaxis: H2B Lines: Central line, Arterial Line, and yes and it is still needed Foley:  Yes, and it is still needed Code Status:  full code Last date of multidisciplinary goals of care discussion [--]  Labs   CBC: Recent Labs  Lab 07/04/24 2045  WBC 9.4  NEUTROABS 2.5  HGB 16.0  HCT 48.8  MCV 97.6  PLT 305    Basic Metabolic Panel:  No results for input(s): NA, K, CL, CO2, GLUCOSE, BUN, CREATININE, CALCIUM, MG, PHOS in the last 168 hours. GFR: CrCl cannot be calculated (Patient's most recent lab result is older than the maximum 21 days allowed.). Recent Labs  Lab 07/04/24 2045  WBC 9.4    Liver  Function Tests: No results for input(s): AST, ALT, ALKPHOS, BILITOT, PROT, ALBUMIN  in the last 168 hours. No results for input(s): LIPASE, AMYLASE in the last 168 hours. No results for input(s): AMMONIA in the last 168 hours.  ABG    Component Value Date/Time   TCO2 22 06/08/2015 2138     Coagulation Profile: No results for input(s): INR, PROTIME in the last 168 hours.  Cardiac Enzymes: No results for input(s): CKTOTAL, CKMB, CKMBINDEX, TROPONINI in the last 168 hours.  HbA1C: No results found for: HGBA1C  CBG: No results for input(s): GLUCAP in the last 168 hours.  Review of Systems:   Unobtainable 2/2 intubated and sedated status  Past Medical History:  He,  has a past medical history of Seizures (HCC).   Surgical History:  No past surgical history on file.   Social History:   reports that he has been smoking. He has never used smokeless tobacco. He reports current alcohol use.  Drug: Marijuana.   Family History:  His family history is not on file.   Allergies No Known Allergies   Home Medications  Prior to Admission medications   Medication Sig Start Date End Date Taking? Authorizing Provider  amoxicillin  (AMOXIL ) 500 MG capsule Take 1 capsule (500 mg total) by mouth 3 (three) times daily. 11/25/21   Haze Lonni PARAS, MD  amoxicillin -clavulanate (AUGMENTIN ) 875-125 MG tablet Take 1 tablet by mouth every 12 (twelve) hours. 08/13/23   Stuart Vernell Norris, PA-C  HYDROcodone -acetaminophen  (NORCO/VICODIN) 5-325 MG tablet Take 1 tablet by mouth every 4 (four) hours as needed. 02/25/16   Idol, Julie, PA-C  naproxen  (NAPROSYN ) 500 MG tablet Take 1 tablet (500 mg total) by mouth 2 (two) times daily as needed. 09/26/19   Rancour, Garnette, MD  phenytoin  (DILANTIN ) 100 MG ER capsule Take 200-300 mg by mouth 2 (two) times daily. Take 2 capsules in the morning and 3 capsules at night.    [provider]  phenytoin  (DILANTIN ) 100  MG ER capsule Take by mouth.    [provider]  predniSONE  (DELTASONE ) 20 MG tablet Take 2 tablets (40 mg total) by mouth daily with breakfast. 11/25/21   Haze Lonni PARAS, MD  predniSONE  (DELTASONE ) 20 MG tablet Take 2 tablets (40 mg total) by mouth daily with breakfast. 08/13/23   Stuart Vernell Norris, PA-C     Critical care time: 

## 2024-07-04 NOTE — ED Provider Notes (Signed)
 [place] [facility]ERGENCY DEPARTMENT AT [facility] Provider Note   CSN: [id number] Arrival date & time: 07/04/24  2031     Patient presents with: No chief complaint on file.   Dale Mason is a 58 y.o. male.  {Add pertinent medical, surgical, social history, OB history to HPI:32947} HPI This patient is a 58 year old male, according to the family member to have spoken with his only medical problem is seizures for which he takes Dilantin .  The patient and his spouse were working in the yard this afternoon when he had 2 separate wasp stings, 1 to the tip of his small finger and 1 to the top of his head.  They were 20 minutes apart.  Shortly after being stung on the top of the head the patient started to have a feeling of weakness, he became diaphoretic weak and was wobbling when he walked, he was extremely weak and when the paramedics found him he was having difficulty breathing.  He then went unresponsive and they had to place a esophageal obturator airway, they were able to oxygenate, they give him a dose of epinephrine , they lost pulses and he went into asystole based on their report.  On arrival the patient is actively getting CPR.    Prior to Admission medications   Medication Sig Start Date End Date Taking? Authorizing Provider  amoxicillin  (AMOXIL ) 500 MG capsule Take 1 capsule (500 mg total) by mouth 3 (three) times daily. 11/25/21   Haze Lonni PARAS, MD  amoxicillin -clavulanate (AUGMENTIN ) 875-125 MG tablet Take 1 tablet by mouth every 12 (twelve) hours. 08/13/23   Stuart Vernell Norris, PA-C  HYDROcodone -acetaminophen  (NORCO/VICODIN) 5-325 MG tablet Take 1 tablet by mouth every 4 (four) hours as needed. 02/25/16   Idol, Julie, PA-C  naproxen  (NAPROSYN ) 500 MG tablet Take 1 tablet (500 mg total) by mouth 2 (two) times daily as needed. 09/26/19   Rancour, Garnette, MD  phenytoin  (DILANTIN ) 100 MG ER capsule Take 200-300 mg by mouth 2 (two) times daily. Take 2 capsules in the  morning and 3 capsules at night.    [provider]  phenytoin  (DILANTIN ) 100 MG ER capsule Take by mouth.    [provider]  predniSONE  (DELTASONE ) 20 MG tablet Take 2 tablets (40 mg total) by mouth daily with breakfast. 11/25/21   Haze Lonni PARAS, MD  predniSONE  (DELTASONE ) 20 MG tablet Take 2 tablets (40 mg total) by mouth daily with breakfast. 08/13/23   Stuart Vernell Norris, PA-C    Allergies: Patient has no known allergies.    Review of Systems  Unable to perform ROS: Acuity of condition    Updated Vital Signs BP (!) 77/53   Resp (!) 8   Physical Exam Vitals and nursing note reviewed.  Constitutional:      General: He is in acute distress.     Appearance: He is well-developed. He is ill-appearing and toxic-appearing.  HENT:     Head: Normocephalic and atraumatic.     Mouth/Throat:     Pharynx: No oropharyngeal exudate.     Comments: Oropharynx examined, there is no swelling of the lips, there is mild edema of the posterior pharynx Eyes:     General: No scleral icterus.       Right eye: No discharge.        Left eye: No discharge.     Conjunctiva/sclera: Conjunctivae normal.     Pupils: Pupils are equal, round, and reactive to light.  Neck:     Thyroid:  No thyromegaly.     Vascular: No JVD.  Cardiovascular:     Heart sounds: Normal heart sounds. No murmur heard.    No friction rub. No gallop.     Comments: No heartbeat on arrival Pulmonary:     Comments: No spontaneous respirations, being assisted with his ventilations Abdominal:     General: Bowel sounds are normal. There is no distension.     Palpations: Abdomen is soft. There is no mass.     Tenderness: There is no abdominal tenderness.  Musculoskeletal:        General: No tenderness. Normal range of motion.     Cervical back: Normal range of motion and neck supple.  Lymphadenopathy:     Cervical: No cervical adenopathy.  Skin:    General: Skin is warm and dry.     Findings: No  erythema or rash.     (all labs ordered are listed, but only abnormal results are displayed) Labs Reviewed  CBC WITH DIFFERENTIAL/PLATELET - Abnormal; Notable for the following components:      Result Value   nRBC 0.6 (*)    All other components within normal limits  COMPREHENSIVE METABOLIC PANEL WITH GFR  LACTIC ACID, PLASMA  LACTIC ACID, PLASMA  PHENYTOIN  LEVEL, TOTAL  TROPONIN I (HIGH SENSITIVITY)    EKG: None  Radiology: No results found.  {Document cardiac monitor, telemetry assessment procedure when appropriate:32947} .Critical Care  Performed by: Cleotilde Rogue, MD Authorized by: Cleotilde Rogue, MD   Critical care provider statement:    Critical care time (minutes):  75   Critical care time was exclusive of:  Separately billable procedures and treating other patients and teaching time   Critical care was necessary to treat or prevent imminent or life-threatening deterioration of the following conditions:  Shock   Critical care was time spent personally by me on the following activities:  Development of treatment plan with patient or surrogate, discussions with consultants, evaluation of patient's response to treatment, examination of patient, obtaining history from patient or surrogate, review of old charts, re-evaluation of patient's condition, pulse oximetry, ordering and review of radiographic studies, ordering and review of laboratory studies and ordering and performing treatments and interventions   I assumed direction of critical care for this patient from another provider in my specialty: no     Care discussed with: admitting provider   Comments:       CPR  Date/Time: 07/04/2024 9:09 PM  Performed by: Cleotilde Rogue, MD Authorized by: Cleotilde Rogue, MD  CPR Procedure Details:      Amount of time prior to administration of ACLS/BLS (minutes):  5   ACLS/BLS initiated by EMS: Yes     CPR/ACLS performed in the ED: Yes     Duration of CPR (minutes):  5   Outcome:  ROSC obtained    CPR performed via ACLS guidelines under my direct supervision.  See RN documentation for details including defibrillator use, medications, doses and timing. Comments:       Date/Time: 07/04/2024 9:10 PM  Performed by: Cleotilde Rogue, MDPre-anesthesia Checklist: Patient identified and Patient being monitored Oxygen Delivery Method: Ambu bag Preoxygenation: Pre-oxygenation with 100% oxygen Induction Type: IV induction and Cricoid Pressure applied Laryngoscope Size: Mac and 4 Grade View: Grade IV Tube size: 7.5 mm Number of attempts: 1 Airway Equipment and Method: Stylet Placement Confirmation: ETT inserted through vocal cords under direct vision Secured at: 25 cm Tube secured with: ETT holder Dental Injury: Teeth and Oropharynx as per pre-operative  assessment  Difficulty Due To: Difficulty was anticipated Comments:      Central Line  Date/Time: 07/04/2024 9:14 PM  Performed by: Cleotilde Rogue, MD Authorized by: Cleotilde Rogue, MD   Consent:    Consent obtained:  Emergent situation Universal protocol:    Immediately prior to procedure, a time out was called: yes     Patient identity confirmed:  Arm band Pre-procedure details:    Indication(s): central venous access     Hand hygiene: Hand hygiene performed prior to insertion     Sterile barrier technique: All elements of maximal sterile technique followed     Skin preparation:  Chlorhexidine    Skin preparation agent: Skin preparation agent completely dried prior to procedure   Sedation:    Sedation type:  None Anesthesia:    Anesthesia method:  None Procedure details:    Location:  R femoral   Patient position:  Supine   Procedural supplies:  Triple lumen   Catheter size:  7 Fr   Landmarks identified: yes     Ultrasound guidance: no     Number of attempts:  1   Successful placement: yes   Post-procedure details:    Post-procedure:  Dressing applied and line sutured   Assessment:  Blood return through  all ports and free fluid flow   Procedure completion:  Tolerated well, no immediate complications Comments:          Medications Ordered in the ED  EPINEPHrine  (ADRENALIN ) 5 mg in NS 250 mL (0.02 mg/mL) premix infusion (7 mcg/min Intravenous Rate/Dose Change 07/04/24 2058)  propofol  (DIPRIVAN ) 1000 MG/100ML infusion (has no administration in time range)  propofol  (DIPRIVAN ) 1000 MG/100ML infusion (has no administration in time range)  rocuronium  (ZEMURON ) 100 MG/10ML injection (100 mg  Given 07/04/24 2033)  etomidate  (AMIDATE ) 2 MG/ML injection (20 mg  Given 07/04/24 2036)  diphenhydrAMINE  (BENADRYL ) 50 MG/ML injection (25 mg  Given 07/04/24 2045)  methylPREDNISolone  sodium succinate (SOLU-MEDROL ) 125 mg/2 mL injection (125 mg  Given 07/04/24 2046)  sodium chloride  0.9 % bolus 1,000 mL (1,000 mLs Intravenous New Bag/Given 07/04/24 2044)  sodium chloride  0.9 % bolus 1,000 mL (1,000 mLs Intravenous New Bag/Given 07/04/24 2052)  norepinephrine  (LEVOPHED ) 4-5 MG/250ML-% infusion SOLN (  New Bag/Given 07/04/24 2104)      {Click here for ABCD2, HEART and other calculators REFRESH Note before signing:1}                              Medical Decision Making Amount and/or Complexity of Data Reviewed Labs: ordered. Radiology: ordered.  Risk Prescription drug management.    This patient presents to the ED for concern of anaphylactic shock or other source of cardiac arrest, this involves an extensive number of treatment options, and is a complaint that carries with it a high risk of complications and morbidity.  The differential diagnosis includes anaphylaxis, could be due to underlying cardiac disease, sepsis, stroke   Co morbidities / Chronic conditions that complicate the patient evaluation  Seizure disorder   Additional history obtained:  Additional history obtained from EMR External records from outside source obtained and reviewed including spouse   Lab Tests:  I Ordered, and personally  interpreted labs.  The pertinent results include:  ***   Imaging Studies ordered:  I ordered imaging studies including ***  I independently visualized and interpreted imaging which showed *** I agree with the radiologist interpretation   Cardiac Monitoring: / EKG:  The patient was maintained on a cardiac monitor.  I personally viewed and interpreted the cardiac monitored which showed an underlying rhythm of: Sinus tachycardia, gradually improving   Problem List / ED Course / Critical interventions / Medication management  Ultimately this patient arrived to the hospital in cardiac arrest, he was actively getting CPR.  I continued CPR on arrival, I intubated the patient with a 7-1/2 ET tube after being given rocuronium , he was given etomidate , he was given epinephrine , we had return of spontaneous circulation but the patient did not wake up.  He is now in sinus tachycardia with pulses and the respiratory therapist was able to obtain an arterial line on the right.  He is currently hypotensive on an epinephrine  drip and appears acutely and critically ill.  This is certainly a life-threatening condition and he may not make it.  I did discuss this with the family, they agree that they want him to be full code. 3 L of IV fluid given, Levophed  added I ordered medication including rocuronium , etomidate , Solu-Medrol , Benadryl , multiple doses of epinephrine  and an epinephrine  drip Reevaluation of the patient after these medicines showed that the patient slight improvement but critically ill I have reviewed the patients home medicines and have made adjustments as needed   Consultations Obtained:  I requested consultation with the critical care intensivist,  and discussed lab and imaging findings as well as pertinent plan - they recommend: Dr. Harlene Na in the ICU who has accepted transfer of this patient immediately to the ICU   Social Determinants of Health:  Anaphylactic shock   Test /  Admission - Considered:  Will need to be admitted to the ICU if he survives   {Document critical care time when appropriate  Document review of labs and clinical decision tools ie CHADS2VASC2, etc  Document your independent review of radiology images and any outside records  Document your discussion with family members, caretakers and with consultants  Document social determinants of health affecting pt's care  Document your decision making why or why not admission, treatments were needed:32947:::1}   Final diagnoses:  None    ED Discharge Orders     None

## 2024-07-05 ENCOUNTER — Inpatient Hospital Stay (HOSPITAL_COMMUNITY)

## 2024-07-05 DIAGNOSIS — J9601 Acute respiratory failure with hypoxia: Secondary | ICD-10-CM | POA: Diagnosis not present

## 2024-07-05 DIAGNOSIS — R0602 Shortness of breath: Secondary | ICD-10-CM | POA: Diagnosis not present

## 2024-07-05 DIAGNOSIS — E8721 Acute metabolic acidosis: Secondary | ICD-10-CM | POA: Diagnosis not present

## 2024-07-05 DIAGNOSIS — T782XXA Anaphylactic shock, unspecified, initial encounter: Secondary | ICD-10-CM | POA: Diagnosis not present

## 2024-07-05 DIAGNOSIS — G40901 Epilepsy, unspecified, not intractable, with status epilepticus: Secondary | ICD-10-CM | POA: Diagnosis not present

## 2024-07-05 DIAGNOSIS — I469 Cardiac arrest, cause unspecified: Secondary | ICD-10-CM | POA: Diagnosis not present

## 2024-07-05 DIAGNOSIS — R4182 Altered mental status, unspecified: Secondary | ICD-10-CM

## 2024-07-05 LAB — COMPREHENSIVE METABOLIC PANEL WITH GFR
ALT: 103 U/L — ABNORMAL HIGH (ref 0–44)
AST: 118 U/L — ABNORMAL HIGH (ref 15–41)
Albumin: 2.9 g/dL — ABNORMAL LOW (ref 3.5–5.0)
Alkaline Phosphatase: 91 U/L (ref 38–126)
Anion gap: 11 (ref 5–15)
BUN: 18 mg/dL (ref 6–20)
CO2: 21 mmol/L — ABNORMAL LOW (ref 22–32)
Calcium: 7.1 mg/dL — ABNORMAL LOW (ref 8.9–10.3)
Chloride: 104 mmol/L (ref 98–111)
Creatinine, Ser: 1.24 mg/dL (ref 0.61–1.24)
GFR, Estimated: >60 mL/min (ref >60)
Glucose, Bld: 290 mg/dL — ABNORMAL HIGH (ref 70–99)
Potassium: 3.3 mmol/L — ABNORMAL LOW (ref 3.5–5.1)
Sodium: 136 mmol/L (ref 135–145)
Total Bilirubin: 0.4 mg/dL (ref 0.0–1.2)
Total Protein: 4.6 g/dL — ABNORMAL LOW (ref 6.5–8.1)

## 2024-07-05 LAB — POCT I-STAT 7, (LYTES, BLD GAS, ICA,H+H)
Acid-base deficit: 11 mmol/L — ABNORMAL HIGH (ref 0.0–2.0)
Acid-base deficit: 2 mmol/L (ref 0.0–2.0)
Acid-base deficit: 8 mmol/L — ABNORMAL HIGH (ref 0.0–2.0)
Bicarbonate: 18.8 mmol/L — ABNORMAL LOW (ref 20.0–28.0)
Bicarbonate: 18.8 mmol/L — ABNORMAL LOW (ref 20.0–28.0)
Bicarbonate: 24.2 mmol/L (ref 20.0–28.0)
Calcium, Ion: 1.04 mmol/L — ABNORMAL LOW (ref 1.15–1.40)
Calcium, Ion: 1.06 mmol/L — ABNORMAL LOW (ref 1.15–1.40)
Calcium, Ion: 1.14 mmol/L — ABNORMAL LOW (ref 1.15–1.40)
HCT: 39 % (ref 39.0–52.0)
HCT: 41 % (ref 39.0–52.0)
HCT: 47 % (ref 39.0–52.0)
Hemoglobin: 13.3 g/dL (ref 13.0–17.0)
Hemoglobin: 13.9 g/dL (ref 13.0–17.0)
Hemoglobin: 16 g/dL (ref 13.0–17.0)
O2 Saturation: 100 %
O2 Saturation: 90 %
O2 Saturation: 97 %
Patient temperature: 36.6
Patient temperature: 37.1
Patient temperature: 38.2
Potassium: 3.3 mmol/L — ABNORMAL LOW (ref 3.5–5.1)
Potassium: 3.7 mmol/L (ref 3.5–5.1)
Potassium: 4 mmol/L (ref 3.5–5.1)
Sodium: 135 mmol/L (ref 135–145)
Sodium: 136 mmol/L (ref 135–145)
Sodium: 138 mmol/L (ref 135–145)
TCO2: 20 mmol/L — ABNORMAL LOW (ref 22–32)
TCO2: 21 mmol/L — ABNORMAL LOW (ref 22–32)
TCO2: 26 mmol/L (ref 22–32)
pCO2 arterial: 43.5 mmHg (ref 32–48)
pCO2 arterial: 48.5 mmHg — ABNORMAL HIGH (ref 32–48)
pCO2 arterial: 55.3 mmHg — ABNORMAL HIGH (ref 32–48)
pH, Arterial: 7.138 — CL (ref 7.35–7.45)
pH, Arterial: 7.25 — ABNORMAL LOW (ref 7.35–7.45)
pH, Arterial: 7.307 — ABNORMAL LOW (ref 7.35–7.45)
pO2, Arterial: 110 mmHg — ABNORMAL HIGH (ref 83–108)
pO2, Arterial: 454 mmHg — ABNORMAL HIGH (ref 83–108)
pO2, Arterial: 75 mmHg — ABNORMAL LOW (ref 83–108)

## 2024-07-05 LAB — TROPONIN I (HIGH SENSITIVITY)
Troponin I (High Sensitivity): 406 ng/L (ref <18)
Troponin I (High Sensitivity): 488 ng/L (ref <18)
Troponin I (High Sensitivity): 371 ng/L (ref <18)

## 2024-07-05 LAB — GLUCOSE, CAPILLARY
Glucose-Capillary: 235 mg/dL — ABNORMAL HIGH (ref 70–99)
Glucose-Capillary: 271 mg/dL — ABNORMAL HIGH (ref 70–99)
Glucose-Capillary: 266 mg/dL — ABNORMAL HIGH (ref 70–99)
Glucose-Capillary: 224 mg/dL — ABNORMAL HIGH (ref 70–99)
Glucose-Capillary: 210 mg/dL — ABNORMAL HIGH (ref 70–99)
Glucose-Capillary: 103 mg/dL — ABNORMAL HIGH (ref 70–99)
Glucose-Capillary: 112 mg/dL — ABNORMAL HIGH (ref 70–99)
Glucose-Capillary: 94 mg/dL (ref 70–99)
Glucose-Capillary: 82 mg/dL (ref 70–99)
Glucose-Capillary: 142 mg/dL — ABNORMAL HIGH (ref 70–99)
Glucose-Capillary: 109 mg/dL — ABNORMAL HIGH (ref 70–99)

## 2024-07-05 LAB — CBC WITH DIFFERENTIAL/PLATELET
Abs Immature Granulocytes: 0.42 K/uL — ABNORMAL HIGH (ref 0.00–0.07)
Basophils Absolute: 0.1 K/uL (ref 0.0–0.1)
Basophils Relative: 0 %
Eosinophils Absolute: 0 K/uL (ref 0.0–0.5)
Eosinophils Relative: 0 %
HCT: 41.4 % (ref 39.0–52.0)
Hemoglobin: 14.5 g/dL (ref 13.0–17.0)
Immature Granulocytes: 1 %
Lymphocytes Relative: 4 %
Lymphs Abs: 1.2 K/uL (ref 0.7–4.0)
MCH: 32.6 pg (ref 26.0–34.0)
MCHC: 35 g/dL (ref 30.0–36.0)
MCV: 93 fL (ref 80.0–100.0)
Monocytes Absolute: 1.9 K/uL — ABNORMAL HIGH (ref 0.1–1.0)
Monocytes Relative: 5 %
Neutro Abs: 31.2 K/uL — ABNORMAL HIGH (ref 1.7–7.7)
Neutrophils Relative %: 90 %
Platelets: 304 K/uL (ref 150–400)
RBC: 4.45 MIL/uL (ref 4.22–5.81)
RDW: 12.7 % (ref 11.5–15.5)
Smear Review: NORMAL
WBC: 34.8 K/uL — ABNORMAL HIGH (ref 4.0–10.5)
nRBC: 0 % (ref 0.0–0.2)

## 2024-07-05 LAB — BASIC METABOLIC PANEL WITH GFR
Anion gap: 12 (ref 5–15)
BUN: 22 mg/dL — ABNORMAL HIGH (ref 6–20)
CO2: 21 mmol/L — ABNORMAL LOW (ref 22–32)
Calcium: 7.2 mg/dL — ABNORMAL LOW (ref 8.9–10.3)
Chloride: 103 mmol/L (ref 98–111)
Creatinine, Ser: 1.71 mg/dL — ABNORMAL HIGH (ref 0.61–1.24)
GFR, Estimated: 46 mL/min — ABNORMAL LOW (ref >60)
Glucose, Bld: 209 mg/dL — ABNORMAL HIGH (ref 70–99)
Potassium: 3.7 mmol/L (ref 3.5–5.1)
Sodium: 136 mmol/L (ref 135–145)

## 2024-07-05 LAB — LACTIC ACID, PLASMA
Lactic Acid, Venous: 2.3 mmol/L (ref 0.5–1.9)
Lactic Acid, Venous: 1.8 mmol/L (ref 0.5–1.9)

## 2024-07-05 LAB — ECHOCARDIOGRAM COMPLETE
Area-P 1/2: 3.85 cm2
Calc EF: 66.5 %
Height: 76 in
S' Lateral: 3.9 cm
Single Plane A2C EF: 68 %
Single Plane A4C EF: 66.2 %
Weight: 3199.32 [oz_av]

## 2024-07-05 LAB — MRSA NEXT GEN BY PCR, NASAL: MRSA by PCR Next Gen: NOT DETECTED

## 2024-07-05 LAB — PHENYTOIN LEVEL, TOTAL: Phenytoin Lvl: 14.4 ug/mL (ref 10.0–20.0)

## 2024-07-05 LAB — CK
Total CK: 734 U/L — ABNORMAL HIGH (ref 49–397)
Total CK: 804 U/L — ABNORMAL HIGH (ref 49–397)

## 2024-07-05 LAB — MAGNESIUM: Magnesium: 1.7 mg/dL (ref 1.7–2.4)

## 2024-07-05 LAB — TRIGLYCERIDES: Triglycerides: 106 mg/dL (ref <150)

## 2024-07-05 LAB — PHOSPHORUS: Phosphorus: 3 mg/dL (ref 2.5–4.6)

## 2024-07-05 MED ORDER — PERFLUTREN LIPID MICROSPHERE
1.0000 mL | INTRAVENOUS | Status: AC | PRN
  Administered 2024-07-05: 2 mL via INTRAVENOUS

## 2024-07-05 MED ORDER — INSULIN ASPART 100 UNIT/ML IJ SOLN
1.0000 [IU] | INTRAMUSCULAR | Status: DC
  Administered 2024-07-05: 3 [IU] via SUBCUTANEOUS

## 2024-07-05 MED ORDER — MIDAZOLAM HCL 2 MG/2ML IJ SOLN
INTRAMUSCULAR | Status: AC
  Filled 2024-07-05: qty 2

## 2024-07-05 MED ORDER — MIDAZOLAM BOLUS VIA INFUSION
10.0000 mg | Freq: Once | INTRAVENOUS | Status: AC
  Administered 2024-07-05: 10 mg via INTRAVENOUS
  Filled 2024-07-05: qty 10

## 2024-07-05 MED ORDER — HEPARIN SODIUM (PORCINE) 5000 UNIT/ML IJ SOLN
5000.0000 [IU] | Freq: Three times a day (TID) | INTRAMUSCULAR | Status: DC
  Administered 2024-07-05 – 2024-07-06 (×3): 5000 [IU] via SUBCUTANEOUS
  Filled 2024-07-05 (×4): qty 1

## 2024-07-05 MED ORDER — SODIUM CHLORIDE 0.9 % IV SOLN
1.0000 g | INTRAVENOUS | Status: DC
  Administered 2024-07-05: 1 g via INTRAVENOUS
  Filled 2024-07-05: qty 10

## 2024-07-05 MED ORDER — MAGNESIUM SULFATE 2 GM/50ML IV SOLN
2.0000 g | Freq: Once | INTRAVENOUS | Status: AC
  Administered 2024-07-05: 2 g via INTRAVENOUS
  Filled 2024-07-05: qty 50

## 2024-07-05 MED ORDER — MIDAZOLAM-SODIUM CHLORIDE 100-0.9 MG/100ML-% IV SOLN
0.0000 mg/h | INTRAVENOUS | Status: DC
  Administered 2024-07-05 – 2024-07-06 (×4): 10 mg/h via INTRAVENOUS
  Administered 2024-07-06 (×2): 20 mg/h via INTRAVENOUS
  Administered 2024-07-07: 10 mg/h via INTRAVENOUS
  Filled 2024-07-05 (×8): qty 100

## 2024-07-05 MED ORDER — METHYLPREDNISOLONE SODIUM SUCC 125 MG IJ SOLR
80.0000 mg | Freq: Two times a day (BID) | INTRAMUSCULAR | Status: DC
  Administered 2024-07-05: 80 mg via INTRAVENOUS
  Filled 2024-07-05: qty 2

## 2024-07-05 MED ORDER — ACETAMINOPHEN 500 MG PO TABS
500.0000 mg | ORAL_TABLET | Freq: Four times a day (QID) | ORAL | Status: DC | PRN
  Administered 2024-07-05 – 2024-07-10 (×3): 500 mg
  Filled 2024-07-05 (×4): qty 1

## 2024-07-05 MED ORDER — INSULIN ASPART 100 UNIT/ML IJ SOLN
0.0000 [IU] | INTRAMUSCULAR | Status: DC
  Administered 2024-07-05 – 2024-07-06 (×3): 2 [IU] via SUBCUTANEOUS
  Administered 2024-07-07: 3 [IU] via SUBCUTANEOUS
  Administered 2024-07-07: 5 [IU] via SUBCUTANEOUS
  Administered 2024-07-07: 2 [IU] via SUBCUTANEOUS
  Administered 2024-07-07: 3 [IU] via SUBCUTANEOUS
  Administered 2024-07-07 – 2024-07-08 (×2): 2 [IU] via SUBCUTANEOUS
  Administered 2024-07-08: 3 [IU] via SUBCUTANEOUS
  Administered 2024-07-08: 2 [IU] via SUBCUTANEOUS
  Administered 2024-07-08: 3 [IU] via SUBCUTANEOUS
  Administered 2024-07-08: 5 [IU] via SUBCUTANEOUS
  Administered 2024-07-08 – 2024-07-09 (×4): 2 [IU] via SUBCUTANEOUS
  Administered 2024-07-09: 3 [IU] via SUBCUTANEOUS
  Administered 2024-07-09: 2 [IU] via SUBCUTANEOUS
  Administered 2024-07-09: 3 [IU] via SUBCUTANEOUS
  Administered 2024-07-10: 2 [IU] via SUBCUTANEOUS

## 2024-07-05 MED ORDER — FAMOTIDINE IN NACL 20-0.9 MG/50ML-% IV SOLN
20.0000 mg | Freq: Two times a day (BID) | INTRAVENOUS | Status: DC
  Administered 2024-07-05 – 2024-07-06 (×3): 20 mg via INTRAVENOUS
  Filled 2024-07-05 (×3): qty 50

## 2024-07-05 MED ORDER — SODIUM CHLORIDE 0.9 % IV SOLN
2.0000 g | INTRAVENOUS | Status: AC
  Administered 2024-07-06 – 2024-07-09 (×4): 2 g via INTRAVENOUS
  Filled 2024-07-05 (×4): qty 20

## 2024-07-05 MED ORDER — METHYLPREDNISOLONE SODIUM SUCC 125 MG IJ SOLR
60.0000 mg | Freq: Every day | INTRAMUSCULAR | Status: DC
  Administered 2024-07-06: 60 mg via INTRAVENOUS
  Filled 2024-07-05: qty 2

## 2024-07-05 MED ORDER — LEVETIRACETAM (KEPPRA) 500 MG/5 ML ADULT IV PUSH
1500.0000 mg | Freq: Two times a day (BID) | INTRAVENOUS | Status: DC
  Administered 2024-07-05 – 2024-07-10 (×11): 1500 mg via INTRAVENOUS
  Filled 2024-07-05 (×11): qty 15

## 2024-07-05 MED ORDER — LEVETIRACETAM (KEPPRA) 500 MG/5 ML ADULT IV PUSH
4500.0000 mg | Freq: Once | INTRAVENOUS | Status: AC
  Administered 2024-07-05: 4500 mg via INTRAVENOUS
  Filled 2024-07-05: qty 45

## 2024-07-05 MED ORDER — DIPHENHYDRAMINE HCL 50 MG/ML IJ SOLN
25.0000 mg | Freq: Three times a day (TID) | INTRAMUSCULAR | Status: DC
  Administered 2024-07-05 – 2024-07-06 (×4): 25 mg via INTRAVENOUS
  Filled 2024-07-05 (×4): qty 1

## 2024-07-05 MED ORDER — MIDAZOLAM HCL 2 MG/2ML IJ SOLN: 10.0000 mg | INTRAMUSCULAR | Status: DC

## 2024-07-05 MED ORDER — LACTATED RINGERS IV BOLUS
1000.0000 mL | Freq: Once | INTRAVENOUS | Status: AC
  Administered 2024-07-05: 1000 mL via INTRAVENOUS

## 2024-07-05 MED ORDER — SODIUM CHLORIDE 0.9 % IV SOLN: INTRAVENOUS | Status: AC | PRN

## 2024-07-05 MED ORDER — MIDAZOLAM HCL 2 MG/2ML IJ SOLN
2.0000 mg | Freq: Once | INTRAMUSCULAR | Status: AC
  Administered 2024-07-05: 2 mg via INTRAVENOUS

## 2024-07-05 MED ORDER — SODIUM CHLORIDE 0.9 % IV SOLN
150.0000 mg | Freq: Two times a day (BID) | INTRAVENOUS | Status: DC
  Administered 2024-07-05 – 2024-07-10 (×11): 150 mg via INTRAVENOUS
  Filled 2024-07-05 (×14): qty 3

## 2024-07-05 MED ORDER — SODIUM BICARBONATE 8.4 % IV SOLN
150.0000 meq | Freq: Once | INTRAVENOUS | Status: AC
  Administered 2024-07-05: 150 meq via INTRAVENOUS
  Filled 2024-07-05: qty 50

## 2024-07-05 MED ORDER — POTASSIUM CHLORIDE 20 MEQ PO PACK
40.0000 meq | PACK | Freq: Once | ORAL | Status: AC
  Administered 2024-07-05: 40 meq
  Filled 2024-07-05: qty 2

## 2024-07-05 MED ORDER — SODIUM BICARBONATE 8.4 % IV SOLN
INTRAVENOUS | Status: AC
  Filled 2024-07-05: qty 100

## 2024-07-05 MED ORDER — SODIUM CHLORIDE 0.9 % IV SOLN
1.0000 g | Freq: Once | INTRAVENOUS | Status: AC
  Administered 2024-07-05: 1 g via INTRAVENOUS
  Filled 2024-07-05: qty 10

## 2024-07-05 MED ORDER — SODIUM CHLORIDE 0.9 % IV SOLN
200.0000 mg | Freq: Every day | INTRAVENOUS | Status: DC
  Administered 2024-07-05 – 2024-07-09 (×5): 200 mg via INTRAVENOUS
  Filled 2024-07-05 (×7): qty 4

## 2024-07-05 MED ORDER — INSULIN REGULAR(HUMAN) IN NACL 100-0.9 UT/100ML-% IV SOLN
INTRAVENOUS | Status: DC
  Administered 2024-07-05: 15 [IU]/h via INTRAVENOUS
  Filled 2024-07-05: qty 100

## 2024-07-05 MED ORDER — SODIUM CHLORIDE 0.9 % IV SOLN
200.0000 mg | Freq: Two times a day (BID) | INTRAVENOUS | Status: DC
  Administered 2024-07-05 – 2024-07-10 (×10): 200 mg via INTRAVENOUS
  Filled 2024-07-05 (×11): qty 20

## 2024-07-05 NOTE — Progress Notes (Addendum)
 Same-day progress note Patient seen and examined No further twitching Changed rapid EEG to regular EEG LTM. Plan of care as an Dr. Luis note. Will follow   15 minutes of additional CC time   -- Eligio Lav, MD Neurologist [facility]

## 2024-07-05 NOTE — Progress Notes (Signed)
 RT attempted aline x2 with no success. CCMD made aware.

## 2024-07-05 NOTE — Procedures (Signed)
 Continuous Video-EEG Monitoring Report  CPT/Type of Study: 95720 (EEG with video, 12-26 hours)      Indications for Procedure: Altered mental status and seizure activity Primary neurological diagnosis: G93.40; R56.9  Duration of study: 07/05/2024 08:52 to 07/06/2024 07:30  History: This is a 58 year old male presenting with altered mental status and seizure activity in the context of anaphylactic shock. Continuous video-EEG monitoring was performed to evaluate for seizure.   Technical Description: The EEG was performed using standard setting per the guidelines of American Clinical Neurophysiology Society (ACNS). A minimum of 21 electrodes were placed on scalp according to the International 10-20 or/and 10-10 Systems. Supplemental electrodes were placed as needed. Single EKG electrode was also used to detect cardiac arrhythmia. Patient's behavior was continuously recorded on video simultaneously with EEG. A minimum of 16 channels were used for data display. Each epoch of study was reviewed manually daily and as needed using standard referential and bipolar montages. Computerized quantitative EEG analysis (such as compressed spectral array analysis, trending, automated spike & seizure detection) were used as indicated.   EEG Description:  Epoch: 07/05/2024 08:52 to 07/06/2024 07:30  Background: There was burst suppression, characterized by background suppression with intermittent generalized bursts of sharply contoured polymorphic activity. The duration of the bursts varied from ~1-30 seconds. After around midnight, there was complete suppression. No posterior dominant rhythm or sleep architecture was recorded.   PERIODIC OR RHYTHMIC PATTERNS: Occasional bursts of generalized periodic discharges (GPDs) or generalized periodic epileptiform discharges were present in latter part of the epoch.   EPILEPTIFORM DISCHARGES: Spikes and sharp waves were present in many of the generalized bursts.    SEIZURES: None.  EVENTS: None.  EKG: No data available.   Impression:  This is an abnormal EEG due to the presence of the following: 1) Burst suppression pattern which progress to complete suppression after midnight, suggesting severe encephalopathy, which is nonspecific as to etiology (medication effect of propofol  and benzodiazepine could not be ruled out); 2) Occasional generalized epileptiform discharges, suggesting diffuse cortical irritability and risk of generalized seizures. No seizure is present.

## 2024-07-05 NOTE — Progress Notes (Signed)
 NAME:  Dale Mason, MRN:  [id number], DOB:  December 21, 1965, LOS: 1 ADMISSION DATE:  07/04/2024, CONSULTATION DATE:  07/04/24 REFERRING MD:  EDP, CHIEF COMPLAINT:  anaphylaxis   History of Present Illness:  58 yo male working outside today when he sustained multiple wasp stings. Pt has sustained wasp stings previously without issue. However after being stung for a second time on his head this evening he was having shortness of breath, feeling of weakness with difficulty walking and wobbling with near falls. EMS was called and upon arrival he was found unresponsive, diaphoretic with agonal breathing and bradycardia, no palpable pulse and ALCS was started, airway established in the field. Pt has multiple rounds of epi between home and hospital pea and asystole at pulse checks, ROSC ultimately achieved and pt was started in epi infusion. EDP states he is adding levophed  at this time as well.   Cvc and arterial line were placed. Labs revealed acidosis, transaminitis and aki. CCM was asked for admission in light of the above.   All history is obtained from chart review, ROS unobtainable 2/2 intubated status.   Upon arrival to [facility] ICU, patient noted to have myoclonic jerking/seizure activity. STAT Ativan  2mg  given ordered by Ema CCM MD Mohon CT-scan of head obtained- no intracranial abnormality noted. Patient's most recent ABG- PH 7.138, Pco2  Update: pt began having seizure like activity. Last sz noted per wife was 2007. He is diligent about his medications and has not missed a dose of dilantin . Eeg and neuro consult ordered. Dilantin  level sent.   Pertinent  Medical History  Sz d/o  Significant Hospital Events: Including procedures, antibiotic start and stop dates in addition to other pertinent events   Presented to [facility] with anaphylactic shock 8/3 Transferred to [facility] for admission to ICU 8/3  Interim History / Subjective:   Objective    Blood pressure 96/61, pulse 96, temperature (!) 101.3 F (38.5  C), resp. rate (!) 28, height 6' 4 (1.93 m), weight 90.7 kg, SpO2 100%.    Vent Mode: PRVC FiO2 (%):  [40 %-100 %] 40 % Set Rate:  [24 bmp-28 bmp] 28 bmp Vt Set:  [690 mL] 690 mL PEEP:  [5 cmH20] 5 cmH20 Plateau Pressure:  [16 cmH20-18 cmH20] 16 cmH20   Intake/Output Summary (Last 24 hours) at 07/05/2024 1543 Last data filed at 07/05/2024 1500 Gross per 24 hour  Intake 5738.03 ml  Output 645 ml  Net 5093.03 ml   Filed Weights   07/04/24 2117  Weight: 90.7 kg    Examination: General: unresponsive on vent, seizure like activity ongoing HENT: ncat, eyelids rhythmic fluttering, mm dry and pale Lungs: coarse bilaterally Cardiovascular: rrr Abdomen: soft, nd bs+ Extremities: no c/c/e Neuro: unresponsive, rhythmic jerking of legs and fluttering of eye lids. Not following commands.  GU: deferred.   Resolved problem list   Assessment and Plan   26 M with a past medical history of seizures post traumatic head injury, presenting after being stung by multiple wasps with presentation concerning for cardiac arrest. He was noted to have convulsions and stiff extrememties followed by unresponsiveness with no palpable pulse. ICU evaluation with concern for anaphylactic shock vs septic shock.   Neuro #Seizures  #Hx of head trauma  #Toxic metabolic encephalopathy #Sedated due to seizures  Continue propofol  at 70, versed  at 10, dilantin  per neuro, keppra  per neuro  - Continue cEEG  - Following med levels   Cardiovascular  Anaphylactic shock vs septic shock  Type II NSTEMI  Currently on levophed  and epi for concern for anaphylaxis  Less concern for cardiogenic shock with normal EF and RV function  Spiked a temp of 38 today and continues on pressors. Concern for sepsis at this point, started CTX for possible aspiration even during episode of unresponsiveness  - Checking blood cultures  - Troponin elevation likely due to demand but will recheck now. TTE reassuring  - Wean steroids to  solumedrol 60mg  daily    Pulmonary  #Acute hypoxic respiratory failure #Bullous emphysema  Currently on low vent settings  Repeat CXR tomorrow  Continue vent ACVC with TV 6-8  GI  #transaminits (likely due to shock) - Will consider tube feeds tomorrow, currently on high pressor requirements will hold off - Continue famotidine  for anaphylaxis and GI ppx   Renal  #AKI  #metabolic acidosis  Likely prerenal/ Renal due to shock and possible ATN  - monitor Cr, avoid nephrotoxins    Endo  #Hyperglycemia  SSI, switched to drip and back to SSI today due to uncontrolled hyperglycemia, steroids likely contributing to hyperglycemia      Best Practice (right click and Reselect all SmartList Selections daily)   Diet/type: NPO DVT prophylaxis prophylactic heparin   Pressure ulcer(s): N/A GI prophylaxis: H2B Lines: Central line, Arterial Line, and yes and it is still needed Foley:  Yes, and it is still needed Code Status:  full code Last date of multidisciplinary goals of care discussion [--]  Labs   CBC: Recent Labs  Lab 07/04/24 2045 07/05/24 0007 07/05/24 0341 07/05/24 0500 07/05/24 1030  WBC 9.4  --   --  34.8*  --   NEUTROABS 2.5  --   --  31.2*  --   HGB 16.0 16.0 13.3 14.5 13.9  HCT 48.8 47.0 39.0 41.4 41.0  MCV 97.6  --   --  93.0  --   PLT 305  --   --  304  --     Basic Metabolic Panel: Recent Labs  Lab 07/04/24 2045 07/05/24 0007 07/05/24 0240 07/05/24 0341 07/05/24 0500 07/05/24 1030 07/05/24 1151  NA 134* 136  --  138 136 135 136  K 3.5 4.0  --  3.3* 3.3* 3.7 3.7  CL 98  --   --   --  104  --  103  CO2 18*  --   --   --  21*  --  21*  GLUCOSE 306*  --   --   --  290*  --  209*  BUN 13  --   --   --  18  --  22*  CREATININE 1.33*  --   --   --  1.24  --  1.71*  CALCIUM 8.4*  --   --   --  7.1*  --  7.2*  MG  --   --  1.7  --   --   --   --   PHOS  --   --  3.0  --   --   --   --    GFR: Estimated Creatinine Clearance: 58.5 mL/min (A) (by C-G  formula based on SCr of 1.71 mg/dL (H)). Recent Labs  Lab 07/04/24 2045 07/04/24 2352 07/05/24 0240 07/05/24 0500  WBC 9.4  --   --  34.8*  LATICACIDVEN 8.4* 2.3* 1.8  --     Liver Function Tests: Recent Labs  Lab 07/04/24 2045 07/05/24 0500  AST 90* 118*  ALT 80* 103*  ALKPHOS 124 91  BILITOT  0.7 0.4  PROT 5.6* 4.6*  ALBUMIN  3.3* 2.9*   No results for input(s): LIPASE, AMYLASE in the last 168 hours. No results for input(s): AMMONIA in the last 168 hours.  ABG    Component Value Date/Time   PHART 7.250 (L) 07/05/2024 1030   PCO2ART 43.5 07/05/2024 1030   PO2ART 110 (H) 07/05/2024 1030   HCO3 18.8 (L) 07/05/2024 1030   TCO2 20 (L) 07/05/2024 1030   ACIDBASEDEF 8.0 (H) 07/05/2024 1030   O2SAT 97 07/05/2024 1030     Coagulation Profile: No results for input(s): INR, PROTIME in the last 168 hours.  Cardiac Enzymes: Recent Labs  Lab 07/05/24 0001 07/05/24 0500  CKTOTAL 734* 804*    HbA1C: No results found for: HGBA1C  CBG: Recent Labs  Lab 07/05/24 0958 07/05/24 1105 07/05/24 1209 07/05/24 1329 07/05/24 1431  GLUCAP 266* 224* 210* 103* 94    Review of Systems:   Unobtainable 2/2 intubated and sedated status  Past Medical History:  He,  has a past medical history of Seizures (HCC).   Surgical History:  No past surgical history on file.   Social History:   reports that he has been smoking. He has never used smokeless tobacco. He reports current alcohol use.  Drug: Marijuana.   Family History:  His family history is not on file.   Allergies No Known Allergies   Home Medications  Prior to Admission medications   Medication Sig Start Date End Date Taking? Authorizing Provider  amoxicillin  (AMOXIL ) 500 MG capsule Take 1 capsule (500 mg total) by mouth 3 (three) times daily. 11/25/21   Haze Lonni PARAS, MD  amoxicillin -clavulanate (AUGMENTIN ) 875-125 MG tablet Take 1 tablet by mouth every 12 (twelve) hours. 08/13/23   Stuart Vernell Norris, PA-C  HYDROcodone -acetaminophen  (NORCO/VICODIN) 5-325 MG tablet Take 1 tablet by mouth every 4 (four) hours as needed. 02/25/16   Idol, Julie, PA-C  naproxen  (NAPROSYN ) 500 MG tablet Take 1 tablet (500 mg total) by mouth 2 (two) times daily as needed. 09/26/19   Rancour, Garnette, MD  phenytoin  (DILANTIN ) 100 MG ER capsule Take 200-300 mg by mouth 2 (two) times daily. Take 2 capsules in the morning and 3 capsules at night.    [provider]  phenytoin  (DILANTIN ) 100 MG ER capsule Take by mouth.    [provider]  predniSONE  (DELTASONE ) 20 MG tablet Take 2 tablets (40 mg total) by mouth daily with breakfast. 11/25/21   Haze Lonni PARAS, MD  predniSONE  (DELTASONE ) 20 MG tablet Take 2 tablets (40 mg total) by mouth daily with breakfast. 08/13/23   Stuart Vernell Norris, PA-C     Critical care time:    The patient is critically ill due to cardiovascular collapse currently needing active titration of vasopressors, acute on chronic hypoxic respiratory failure with active vent management .  Critical care was necessary to treat or prevent imminent or life-threatening deterioration. Critical care time was spent by me on the following activities: development of a treatment plan with the patient and/or surrogate as well as nursing, discussions with consultants, evaluation of the patient's response to treatment, examination of the patient, obtaining a history from the patient or surrogate, ordering and performing treatments and interventions, ordering and review of laboratory studies, ordering and review of radiographic studies, review of telemetry data including pulse oximetry, re-evaluation of patient's condition and participation in multidisciplinary rounds.   I personally spent 46 minutes providing critical care not including any separately billable procedures.   Zola  LOISE Herter, MD [facility] 07/05/2024 5:04 PM

## 2024-07-05 NOTE — ED Notes (Signed)
 2031 - 1mg  IV EPI given   2033 - preparing for intubation, verbal orders to give 100mg  of Roc at this time  2034 - pulse check -> PEA  2035 - 1mg  IV EPI given   2036 - pulse check -> pulses felt at carotid and femoral pulses, SR on the monitor, CPR stopped  2036- Etomidate  20 mg IV given  2037 - intubation tube begins with a 7.5 fr tube  2038 - intubation tube placed 25cm at the lip  2040 - OG tube placed using a 45fr tube and 65cm at the lip

## 2024-07-05 NOTE — Progress Notes (Incomplete)
 Date and time results received: 07/05/24 1:47 AM  (use smartphrase .now to insert current time)  Test: *** Critical Value: ***  Name of Provider Notified: ***  Orders Received? Or Actions Taken?: {ED Critical Value actions [phone]
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
## 2024-07-05 NOTE — Progress Notes (Cosign Needed Addendum)
 LTM EEG hooked up and running - no initial skin breakdown - push button tested - [facility] monitoring.

## 2024-07-05 NOTE — Progress Notes (Addendum)
 EMU technologist notified me of longer bursts. Review does reveal longer burst than what they were in the morning. Patient currently on Keppra  1500 twice daily, phenytoin  150 twice daily. I would start him on Vimpat  200 twice daily. Will follow  Addendum Continued longer burst. Bolus Versed  10 and increased rate to 20 mg/h   15 minutes of additional CC time   -- Eligio Lav, MD Neurologist [facility]

## 2024-07-05 NOTE — Progress Notes (Signed)
Transported patient to C.T while patient was on the ventilator. Patient remained stable during transport.

## 2024-07-05 NOTE — Progress Notes (Signed)
 [facility] ADULT ICU REPLACEMENT PROTOCOL   The patient does apply for the [facility] Adult ICU Electrolyte Replacment Protocol based on the criteria listed below:   1.Exclusion criteria: TCTS, ECMO, Dialysis, and Myasthenia Gravis patients 2. Is GFR >/= 30 ml/min? Yes.    Patient's GFR today is >60 3. Is SCr </= 2? Yes.   Patient's SCr is 1.24 mg/dL 4. Did SCr increase >/= 0.5 in 24 hours? No. 5.Pt's weight >40kg  Yes.   6. Abnormal electrolyte(s): K+ 3.3, Mag 1.7  7. Electrolytes replaced per protocol 8.  Call MD STAT for K+ </= 2.5, Phos </= 1, or Mag </= 1 Physician:  Dr, Rober Rummer, [place] ORN 07/05/2024 6:07 AM

## 2024-07-05 NOTE — Progress Notes (Signed)
 Initial Nutrition Assessment  DOCUMENTATION CODES:  Not applicable  INTERVENTION:  When metabolically stable, recommend the following: Initiate tube feeding via OGT: Vital 1.5 at 65 ml/h (1560 ml per day) Start at 25 and advance by 10mL every 8 hours to reach goal Prosource TF20 60 ml x/d Provides 2420 kcal, 125 gm protein, 1192 ml free water daily   NUTRITION DIAGNOSIS:  Inadequate oral intake related to inability to eat as evidenced by NPO status.  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 EMS placed esophageal obturator airway in the field and CPR initiated when pt lost pulse.   Patient is currently intubated on ventilator support. Son at bedside able to provide some hx, but states that he has not seen pt recently so unable to give specifics. States that he feels pt's weight looks stable without drastic changes. Unsure of normal eating habits. On exam, muscle and fat stores intact.   Pt discussed during ICU rounds and with RN and MD. Pt currently deeply sedated on continuous EEG. Pressor support - rates slightly increasing throughout the morning and also on insulin  drip for glucose control. Hold on initiating enteral feeds today until more metabolically stable. Recommendations are above.   MV: 19.1 L/min Temp (24hrs), Avg:99.3 F (37.4 C), Min:95.4 F (35.2 C), Max:102 F (38.9 C) MAP (Art Line):  Propofol : 38.1 ml/hr (1006 kcal/d)  Admit / Current weight: 90.7 kg    Intake/Output Summary (Last 24 hours) at 07/05/2024 1602 Last data filed at 07/05/2024 1500 Gross per 24 hour  Intake 5738.03 ml  Output 645 ml  Net 5093.03 ml  Net IO Since Admission: 5,093.03 mL [07/05/24 1602]  Drains/Lines: CVC Triple Lumen  right femoral Art Line (left radial) OGT 16 Fr.  UOP out x 24 hours  Nutritionally Relevant Medications: Scheduled Meds:  diphenhydrAMINE   25 mg Intravenous Q8H   methylPREDNISolone  (SOLU-MEDROL )   60 mg Intravenous Daily   Continuous Infusions:  cefTRIAXone  (ROCEPHIN )  IV Stopped (07/05/24 1000)   epinephrine  9 mcg/min (07/05/24 1400)   famotidine  (PEPCID ) IV Stopped (07/05/24 0431)   insulin  Stopped (07/05/24 1330)   norepinephrine  (LEVOPHED ) Adult infusion 19 mcg/min (07/05/24 1400)   propofol  (DIPRIVAN ) infusion 70 mcg/kg/min (07/05/24 1400)   Labs Reviewed: BUN 22, creatinine 1.71 CBG ranges from 210-271 mg/dL over the last 24 hours  NUTRITION - FOCUSED PHYSICAL EXAM: Flowsheet Row Most Recent Value  Orbital Region No depletion  Upper Arm Region No depletion  Thoracic and Lumbar Region No depletion  Buccal Region No depletion  Temple Region No depletion  Clavicle Bone Region No depletion  Clavicle and Acromion Bone Region No depletion  Scapular Bone Region No depletion  Dorsal Hand No depletion  Patellar Region No depletion  Anterior Thigh Region No depletion  Posterior Calf Region No depletion  Edema (RD Assessment) None  Hair Reviewed  Eyes Reviewed  Mouth Reviewed  Skin Reviewed  Nails Reviewed    Diet Order:   Diet Order             Diet NPO time specified  Diet effective now                   EDUCATION NEEDS:  Not appropriate for  education at this time  Skin:  Skin Assessment: Reviewed RN Assessment  Last BM:  unsure  Height:  Ht Readings from Last 1 Encounters:  07/04/24 6' 4 (1.93 m)    Weight:  Wt Readings from Last 1 Encounters:  07/04/24 90.7 kg    Ideal Body Weight:  91.8 kg  BMI:  Body mass index is 24.34 kg/m.  Estimated Nutritional Needs:  Kcal:  2500-2700 kcal/d Protein:  125-140g/d Fluid:  2.5L/d    Vernell Lukes, RD, LDN, CNSC Registered Dietitian II Please reach out via secure chat

## 2024-07-06 ENCOUNTER — Inpatient Hospital Stay (HOSPITAL_COMMUNITY)

## 2024-07-06 DIAGNOSIS — G40901 Epilepsy, unspecified, not intractable, with status epilepticus: Secondary | ICD-10-CM | POA: Diagnosis not present

## 2024-07-06 DIAGNOSIS — T782XXA Anaphylactic shock, unspecified, initial encounter: Secondary | ICD-10-CM | POA: Diagnosis not present

## 2024-07-06 DIAGNOSIS — J9601 Acute respiratory failure with hypoxia: Secondary | ICD-10-CM | POA: Diagnosis not present

## 2024-07-06 DIAGNOSIS — I469 Cardiac arrest, cause unspecified: Secondary | ICD-10-CM | POA: Diagnosis not present

## 2024-07-06 DIAGNOSIS — T63461A Toxic effect of venom of wasps, accidental (unintentional), initial encounter: Secondary | ICD-10-CM | POA: Diagnosis not present

## 2024-07-06 DIAGNOSIS — E8721 Acute metabolic acidosis: Secondary | ICD-10-CM | POA: Diagnosis not present

## 2024-07-06 LAB — CBC WITH DIFFERENTIAL/PLATELET
Abs Immature Granulocytes: 0.1 K/uL — ABNORMAL HIGH (ref 0.00–0.07)
Basophils Absolute: 0 K/uL (ref 0.0–0.1)
Basophils Relative: 0 %
Eosinophils Absolute: 0.2 K/uL (ref 0.0–0.5)
Eosinophils Relative: 1 %
HCT: 38.4 % — ABNORMAL LOW (ref 39.0–52.0)
Hemoglobin: 13.5 g/dL (ref 13.0–17.0)
Immature Granulocytes: 1 %
Lymphocytes Relative: 20 %
Lymphs Abs: 3.5 K/uL (ref 0.7–4.0)
MCH: 32 pg (ref 26.0–34.0)
MCHC: 35.2 g/dL (ref 30.0–36.0)
MCV: 91 fL (ref 80.0–100.0)
Monocytes Absolute: 1.4 K/uL — ABNORMAL HIGH (ref 0.1–1.0)
Monocytes Relative: 8 %
Neutro Abs: 12.7 K/uL — ABNORMAL HIGH (ref 1.7–7.7)
Neutrophils Relative %: 70 %
Platelets: 196 K/uL (ref 150–400)
RBC: 4.22 MIL/uL (ref 4.22–5.81)
RDW: 12.9 % (ref 11.5–15.5)
WBC: 17.9 K/uL — ABNORMAL HIGH (ref 4.0–10.5)
nRBC: 0 % (ref 0.0–0.2)

## 2024-07-06 LAB — BASIC METABOLIC PANEL WITH GFR
Anion gap: 6 (ref 5–15)
Anion gap: 10 (ref 5–15)
BUN: 19 mg/dL (ref 6–20)
BUN: 17 mg/dL (ref 6–20)
CO2: 20 mmol/L — ABNORMAL LOW (ref 22–32)
CO2: 23 mmol/L (ref 22–32)
Calcium: 7.1 mg/dL — ABNORMAL LOW (ref 8.9–10.3)
Calcium: 7.4 mg/dL — ABNORMAL LOW (ref 8.9–10.3)
Chloride: 105 mmol/L (ref 98–111)
Chloride: 100 mmol/L (ref 98–111)
Creatinine, Ser: 0.91 mg/dL (ref 0.61–1.24)
Creatinine, Ser: 0.95 mg/dL (ref 0.61–1.24)
GFR, Estimated: >60 mL/min (ref >60)
GFR, Estimated: >60 mL/min (ref >60)
Glucose, Bld: 107 mg/dL — ABNORMAL HIGH (ref 70–99)
Glucose, Bld: 139 mg/dL — ABNORMAL HIGH (ref 70–99)
Potassium: 4 mmol/L (ref 3.5–5.1)
Potassium: 4.5 mmol/L (ref 3.5–5.1)
Sodium: 131 mmol/L — ABNORMAL LOW (ref 135–145)
Sodium: 133 mmol/L — ABNORMAL LOW (ref 135–145)

## 2024-07-06 LAB — GLUCOSE, CAPILLARY
Glucose-Capillary: 130 mg/dL — ABNORMAL HIGH (ref 70–99)
Glucose-Capillary: 99 mg/dL (ref 70–99)
Glucose-Capillary: 81 mg/dL (ref 70–99)
Glucose-Capillary: 109 mg/dL — ABNORMAL HIGH (ref 70–99)
Glucose-Capillary: 130 mg/dL — ABNORMAL HIGH (ref 70–99)

## 2024-07-06 LAB — POCT I-STAT 7, (LYTES, BLD GAS, ICA,H+H)
Acid-base deficit: 3 mmol/L — ABNORMAL HIGH (ref 0.0–2.0)
Bicarbonate: 23.2 mmol/L (ref 20.0–28.0)
Calcium, Ion: 1.07 mmol/L — ABNORMAL LOW (ref 1.15–1.40)
HCT: 36 % — ABNORMAL LOW (ref 39.0–52.0)
Hemoglobin: 12.2 g/dL — ABNORMAL LOW (ref 13.0–17.0)
O2 Saturation: 87 %
Patient temperature: 97.9
Potassium: 5.5 mmol/L — ABNORMAL HIGH (ref 3.5–5.1)
Sodium: 133 mmol/L — ABNORMAL LOW (ref 135–145)
TCO2: 24 mmol/L (ref 22–32)
pCO2 arterial: 42.5 mmHg (ref 32–48)
pH, Arterial: 7.343 — ABNORMAL LOW (ref 7.35–7.45)
pO2, Arterial: 55 mmHg — ABNORMAL LOW (ref 83–108)

## 2024-07-06 LAB — TRIGLYCERIDES: Triglycerides: 152 mg/dL — ABNORMAL HIGH (ref <150)

## 2024-07-06 LAB — PHOSPHORUS: Phosphorus: 4.3 mg/dL (ref 2.5–4.6)

## 2024-07-06 LAB — MAGNESIUM: Magnesium: 2.2 mg/dL (ref 1.7–2.4)

## 2024-07-06 MED ORDER — VITAL 1.5 CAL PO LIQD
1000.0000 mL | ORAL | Status: DC
  Administered 2024-07-06 – 2024-07-09 (×4): 1000 mL
  Filled 2024-07-06: qty 1000

## 2024-07-06 MED ORDER — POLYETHYLENE GLYCOL 3350 17 G PO PACK
17.0000 g | PACK | Freq: Every day | ORAL | Status: DC
  Administered 2024-07-06 – 2024-07-10 (×5): 17 g
  Filled 2024-07-06 (×5): qty 1

## 2024-07-06 MED ORDER — FUROSEMIDE 10 MG/ML IJ SOLN
40.0000 mg | Freq: Once | INTRAMUSCULAR | Status: AC
  Administered 2024-07-06: 40 mg via INTRAVENOUS
  Filled 2024-07-06: qty 4

## 2024-07-06 MED ORDER — VITAL HP 1.0 CAL PO LIQD: 1000.0000 mL | ORAL | Status: DC

## 2024-07-06 MED ORDER — DOCUSATE SODIUM 50 MG/5ML PO LIQD
100.0000 mg | Freq: Two times a day (BID) | ORAL | Status: DC
  Administered 2024-07-06 – 2024-07-10 (×8): 100 mg
  Filled 2024-07-06 (×8): qty 10

## 2024-07-06 MED ORDER — PROSOURCE TF20 ENFIT COMPATIBL EN LIQD
60.0000 mL | Freq: Every day | ENTERAL | Status: DC
  Administered 2024-07-06 – 2024-07-09 (×4): 60 mL
  Filled 2024-07-06 (×4): qty 60

## 2024-07-06 MED ORDER — FENTANYL BOLUS VIA INFUSION
25.0000 ug | INTRAVENOUS | Status: DC | PRN
  Administered 2024-07-07 – 2024-07-08 (×3): 100 ug via INTRAVENOUS

## 2024-07-06 MED ORDER — ENOXAPARIN SODIUM 40 MG/0.4ML IJ SOSY
40.0000 mg | PREFILLED_SYRINGE | INTRAMUSCULAR | Status: DC
  Administered 2024-07-06 – 2024-07-09 (×4): 40 mg via SUBCUTANEOUS
  Filled 2024-07-06 (×4): qty 0.4

## 2024-07-06 MED ORDER — FUROSEMIDE 10 MG/ML IJ SOLN
INTRAMUSCULAR | Status: AC
  Filled 2024-07-06: qty 4

## 2024-07-06 MED ORDER — PANTOPRAZOLE SODIUM 40 MG IV SOLR
40.0000 mg | INTRAVENOUS | Status: DC
  Administered 2024-07-07 – 2024-07-10 (×4): 40 mg via INTRAVENOUS
  Filled 2024-07-06 (×4): qty 10

## 2024-07-06 NOTE — TOC CM/SW Note (Signed)
 Transition of Care [facility]) - Inpatient Brief Assessment   Patient Details  Name: Dale Mason MRN: [id number] Date of Birth: 01/14/1966  Transition of Care [facility]) CM/SW Contact:    Lauraine FORBES Saa, LCSW Phone Number: 07/06/2024, 9:58 AM   Clinical Narrative:  9:58 AM Per chart review, patient is currently intubated and unable to answer SDOH/admission questions. Patient does not have SNF/HH/DME history. Patient's preferred pharmacy's are [facility] [address] and [facility] [address]. No TOC needs were identified at this time. TOC will continue to follow and be available to assist.  Transition of Care Asessment: Insurance and Status: Insurance coverage has been reviewed Patient has primary care physician: Yes Home environment has been reviewed: Private Residence Prior level of function:: N/A Prior/Current Home Services: No current home services Social Drivers of Health Review:  (Patient unable to answer) Readmission risk has been reviewed: Yes Transition of care needs: no transition of care needs at this time

## 2024-07-06 NOTE — Progress Notes (Signed)
 Brief Nutrition Support Note  Pt remains intubated and sedated at this time. Likely will not have sedation weaned until tomorrow, still on continuous EEG and sedation needed for burst suppression.   MD ok with starting enteral feeds. Pressor needs have improved in the last 24 hours. For last full assessment, see RD note from 8/5.  Initiate tube feeding via OGT: Vital 1.5 at 65 ml/h (1560 ml per day) Start at 25 and advance by 10mL every 8 hours to reach goal Prosource TF20 60 ml x/d Provides 2420 kcal, 125 gm protein, 1192 ml free water daily  Dale Mason, RD, LDN, CNSC Registered Dietitian II Please reach out via secure chat

## 2024-07-06 NOTE — Progress Notes (Signed)
 NEUROLOGY CONSULT FOLLOW UP NOTE   Date of service: July 06, 2024 Patient Name: Dale Mason MRN:  [id number] DOB:  1966/09/18  Interval Hx/subjective   Seen and examined. Continuous video EEG shows burst suppression pattern with progress to complete suppression after midnight suggesting severe encephalopathy.  Likely medication side effect.  Occasional generalized GLUT1 discharges suggesting diffuse cortical irritability. Vitals   Vitals:   07/06/24 1200 07/06/24 1215 07/06/24 1230 07/06/24 1245  BP:   106/71 108/74  Pulse: 85 88 87 87  Resp: (!) 28 (!) 28 (!) 28 (!) 28  Temp:      TempSrc:      SpO2: 93% 93% 93% 94%  Weight:      Height:         Body mass index is 24.34 kg/m.  Physical Exam  General: Heavily sedated, intubated HEENT: Normocephalic atraumatic Lungs: Vented Cardiovascular: RRR Neurological exam Heavily sedated and intubated Pupils sluggishly reactive Corneal reflexes present bilaterally but sluggish Very weak cough and gag No spontaneous movement No movement to noxious stimulation   Medications  Current Facility-Administered Medications:    acetaminophen  (TYLENOL ) tablet 500 mg, 500 mg, Per Tube, Q6H PRN, Amoako, Prince, MD, 500 mg at 07/05/24 1200   cefTRIAXone  (ROCEPHIN ) 2 g in sodium chloride  0.9 % 100 mL IVPB, 2 g, Intravenous, Q24H, Hattar, Zola SAILOR, MD, Paused at 07/06/24 0955   Chlorhexidine  Gluconate Cloth 2 % PADS 6 each, 6 each, Topical, Daily, Layman Raisin, DO, 6 each at 07/06/24 0926   docusate (COLACE) 50 MG/5ML liquid 100 mg, 100 mg, Per Tube, BID, Merilee Linsey I, RPH, 100 mg at 07/06/24 1249   enoxaparin  (LOVENOX ) injection 40 mg, 40 mg, Subcutaneous, Q24H, Hattar, Zola SAILOR, MD   feeding supplement (PROSource TF20) liquid 60 mL, 60 mL, Per Tube, Daily, Hattar, Zola SAILOR, MD, 60 mL at 07/06/24 1249   feeding supplement (VITAL 1.5 CAL) liquid 1,000 mL, 1,000 mL, Per Tube, Continuous, Hattar, Zola SAILOR, MD   fentaNYL  (SUBLIMAZE )  bolus via infusion 25-100 mcg, 25-100 mcg, Intravenous, Q15 min PRN, Merilee Linsey I, [facility]   fentaNYL  in NS (37mcg/ml) infusion-PREMIX, 0-400 mcg/hr, Intravenous, Continuous, Cleotilde Rogue, MD, Last Rate: 7.5 mL/hr at 07/06/24 1200, 75 mcg/hr at 07/06/24 1200   insulin  aspart (novoLOG ) injection 0-15 Units, 0-15 Units, Subcutaneous, Q4H, Hattar, Zola SAILOR, MD, 2 Units at 07/06/24 0307   lacosamide  (VIMPAT ) 200 mg in sodium chloride  0.9 % 25 mL IVPB, 200 mg, Intravenous, Q12H, Arnav Cregg, MD, Stopped at 07/06/24 1033   levETIRAcetam  (KEPPRA ) undiluted injection 1,500 mg, 1,500 mg, Intravenous, Q12H, Michaela Aisha SQUIBB, MD, 1,500 mg at 07/06/24 0920   midazolam  (VERSED ) 100 mg/100 mL (1 mg/mL) premix infusion, 10 mg/hr, Intravenous, Continuous, Voncile Isles, MD, Last Rate: 10 mL/hr at 07/06/24 1259, 10 mg/hr at 07/06/24 1259   Oral care mouth rinse, 15 mL, Mouth Rinse, Q2H, Marshall, Jessica, DO, 15 mL at 07/06/24 1249   Oral care mouth rinse, 15 mL, Mouth Rinse, PRN, Layman Raisin, DO   [START ON 07/07/2024] pantoprazole  (PROTONIX ) injection 40 mg, 40 mg, Intravenous, Q24H, Hattar, Zola SAILOR, MD   phenytoin  (DILANTIN ) 150 mg in sodium chloride  0.9 % 25 mL IVPB, 150 mg, Intravenous, BID, Michaela Aisha SQUIBB, MD, Stopped at 07/06/24 [phone]   phenytoin  (DILANTIN ) 200 mg in sodium chloride  0.9 % 25 mL IVPB, 200 mg, Intravenous, Q1400, Michaela Aisha SQUIBB, MD, Paused at 07/05/24 1508   polyethylene glycol (MIRALAX  / GLYCOLAX ) packet 17 g, 17 g, Per Tube,  Daily, Merilee Linsey I, RPH, 17 g at 07/06/24 1249   propofol  (DIPRIVAN ) 1000 MG/100ML infusion, 20 mcg/kg/min, Intravenous, Continuous, Voncile Isles, MD, Last Rate: 10.88 mL/hr at 07/06/24 1259, 20 mcg/kg/min at 07/06/24 1259  Labs and Diagnostic Imaging   CBC:  Recent Labs  Lab 07/05/24 0500 07/05/24 1030 07/06/24 0430 07/06/24 1146  WBC 34.8*  --  17.9*  --   NEUTROABS 31.2*  --  12.7*  --   HGB 14.5   < > 13.5  12.2*  HCT 41.4   < > 38.4* 36.0*  MCV 93.0  --  91.0  --   PLT 304  --  196  --    < > = values in this interval not displayed.    Basic Metabolic Panel:  Lab Results  Component Value Date   NA 133 (L) 07/06/2024   K 5.5 (H) 07/06/2024   CO2 20 (L) 07/06/2024   GLUCOSE 107 (H) 07/06/2024   BUN 19 07/06/2024   CREATININE 0.91 07/06/2024   CALCIUM 7.1 (L) 07/06/2024   GFRNONAA >60 07/06/2024   AED levels:  Lab Results  Component Value Date   PHENYTOIN  14.4 07/05/2024    CT Head without contrast(Personally reviewed): No acute process   Continuous video EEG shows burst suppression pattern with progress to complete suppression after midnight suggesting severe encephalopathy.  Likely medication side effect.  Occasional generalized GLUT1 discharges suggesting diffuse cortical irritability.  Assessment   Dale Mason is a 58 y.o. 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  He complained of not feeling well and asked his wife to call 911 after the wasp stings and became unresponsive.  On EMS arrival he was once responsive and diaphoretic with no palpable pulse.  Airway established in the field had multiple rounds of epi during the CPR.  After transferred from [facility] it was noted that he had continuous stitching of the eyelid concerning for ongoing seizures.  He was increased on propofol  that he was at at [facility] and 75 mcg/kg/min to 100. Versed  10 mg followed by 5 mg/h Versed  drip.  He was continued on Dilantin  and started on Keppra  after load. Rapid EEG was started which was changed to regular EEG which showed continuing epileptiform discharges and increasing frequency of bursts for which Vimpat  200 mg was added and Versed  rate was increased to 20 mg/h eventually. He was adequately versed  suppressed after dose increases, sometime after 8 PM yesterday. Would like him to  be burst suppressed for 24 hours before minimizing sedation but his EEG is very flat so I would reduce Versed  from 20-10.  Impression: Breakthrough seizure with status epilepticus likely provoked by wasp bite-eventually leading to cardiac arrest  Recommendations  Continue Keppra  1500 twice daily, Vimpat  200 twice daily, phenytoin  150 twice daily. Decrease Versed  from 20 mg/h to 10 mg/h. Continue propofol  at 70 mcg/kg/min. Continue the LTM EEG for now Maintain seizure precautions General Medical management per CCM team as you are Will follow  Plan discussed with Dr. Zaida ______________________________________________________________________   Signed, Isles Voncile, MD [facility]   CRITICAL CARE ATTESTATION Performed by: Isles Voncile, MD Total critical care time: 45 minutes Critical care time was exclusive of separately billable procedures and treating other patients and/or supervising APPs/Residents/Students Critical care was necessary to treat or prevent imminent or life-threatening deterioration. This patient is critically ill and at significant risk for neurological worsening  and/or death and care requires constant monitoring. Critical care was time spent personally by me on the following activities: development of treatment plan with patient and/or surrogate as well as nursing, discussions with consultants, evaluation of patient's response to treatment, examination of patient, obtaining history from patient or surrogate, ordering and performing treatments and interventions, ordering and review of laboratory studies, ordering and review of radiographic studies, pulse oximetry, re-evaluation of patient's condition, participation in multidisciplinary rounds and medical decision making of high complexity in the care of this patient.

## 2024-07-06 NOTE — Progress Notes (Signed)
 NAME:  Cara Aguino, MRN:  [id number], DOB:  1965-12-03, LOS: 2 ADMISSION DATE:  07/04/2024, CONSULTATION DATE:  07/04/24 REFERRING MD:  EDP, CHIEF COMPLAINT:  anaphylaxis   History of Present Illness:  58 yo male working outside today when he sustained multiple wasp stings. Pt has sustained wasp stings previously without issue. However after being stung for a second time on his head this evening he was having shortness of breath, feeling of weakness with difficulty walking and wobbling with near falls. EMS was called and upon arrival he was found unresponsive, diaphoretic with agonal breathing and bradycardia, no palpable pulse and ALCS was started, airway established in the field. Pt has multiple rounds of epi between home and hospital pea and asystole at pulse checks, ROSC ultimately achieved and pt was started in epi infusion. EDP states he is adding levophed  at this time as well.   Cvc and arterial line were placed. Labs revealed acidosis, transaminitis and aki. CCM was asked for admission in light of the above.   All history is obtained from chart review, ROS unobtainable 2/2 intubated status.   Upon arrival to [facility] ICU, patient noted to have myoclonic jerking/seizure activity. STAT Ativan  2mg  given ordered by Ema CCM MD Mohon CT-scan of head obtained- no intracranial abnormality noted. Patient's most recent ABG- PH 7.138, Pco2  Update: pt began having seizure like activity. Last sz noted per wife was 2007. He is diligent about his medications and has not missed a dose of dilantin . Eeg and neuro consult ordered. Dilantin  level sent.   Pertinent  Medical History  Sz d/o  Significant Hospital Events: Including procedures, antibiotic start and stop dates in addition to other pertinent events   Presented to [facility] with anaphylactic shock 8/3 Transferred to [facility] for admission to ICU 8/3  Interim History / Subjective:   Objective    Blood pressure 96/61, pulse 87, temperature (!) 96.5 F (35.8  C), resp. rate (!) 28, height 6' 4 (1.93 m), weight 90.7 kg, SpO2 100%.    Vent Mode: PRVC FiO2 (%):  [40 %] 40 % Set Rate:  [28 bmp] 28 bmp Vt Set:  [690 mL] 690 mL PEEP:  [5 cmH20] 5 cmH20 Plateau Pressure:  [16 cmH20-18 cmH20] 17 cmH20   Intake/Output Summary (Last 24 hours) at 07/06/2024 1047 Last data filed at 07/06/2024 1000 Gross per 24 hour  Intake 3634.47 ml  Output 925 ml  Net 2709.47 ml   Filed Weights   07/04/24 2117  Weight: 90.7 kg    Examination: General: unresponsive on vent, seizure like activity ongoing HENT: ncat, eyelids rhythmic fluttering, mm dry and pale Lungs: coarse bilaterally Cardiovascular: rrr Abdomen: soft, nd bs+ Extremities: no c/c/e Neuro: unresponsive, rhythmic jerking of legs and fluttering of eye lids. Not following commands.  GU: deferred.   Resolved problem list   Assessment and Plan   77 M with a past medical history of seizures post traumatic head injury, presenting after being stung by multiple wasps with presentation concerning for cardiac arrest. He was noted to have convulsions and stiff extrememties followed by unresponsiveness with no palpable pulse. ICU evaluation with concern for anaphylactic shock vs septic shock.   Neuro #Seizures  #Hx of head trauma  #Toxic metabolic encephalopathy #Sedated due to seizures  Continue propofol  , versed  , dilantin  per neuro, keppra  per neuro  - Continue cEEG : flat EEG per neuro with burst supression, weaning sedation per neuro  - Following med levels   Cardiovascular  Anaphylactic shock  vs septic shock  Type II NSTEMI  Off pressors today  Less concern for cardiogenic shock with normal EF and RV function  Spiked a temp of 38 today and continues on pressors. Concern for sepsis at this point, started CTX for possible aspiration even during episode of unresponsiveness. Plan for a 5 day course  - blood cultures so far no growth  - Troponin elevation likely due to demand but will recheck  now. TTE reassuring  - Stop steroids - D/C Pressors  - Stop H1 blocker    Pulmonary  #Acute hypoxic respiratory failure #Bullous emphysema  Currently on low vent settings  Repeat CXR tomorrow  Continue vent ACVC with TV 6-8  GI  #transaminits (likely due to shock) - Start tube feeds  - Switch pepcid  to PPI   Renal  #AKI  (improved) #metabolic acidosis  #hyperkalemia  Likely prerenal/ Renal due to shock and possible ATN  - monitor Cr, avoid nephrotoxins  - Lasix  40mg  IV today   Endo  #Hyperglycemia  SSI, switched to drip and back to SSI today due to uncontrolled hyperglycemia, steroids likely contributing to hyperglycemia    Onc  Switch heparin  to lovenox    ID  Continue CTX for 5 days      Best Practice (right click and Reselect all SmartList Selections daily)   Diet/type: Tube feeds  DVT prophylaxis Lovenox  Pressure ulcer(s): N/A GI prophylaxis: H2B Lines: Central line, Arterial Line, and yes and it is still needed, can D/C A line tomorrow  Foley:  Yes, and it is still needed Code Status:  full code Last date of multidisciplinary goals of care discussion [--]  Labs   CBC: Recent Labs  Lab 07/04/24 2045 07/05/24 0007 07/05/24 0341 07/05/24 0500 07/05/24 1030 07/06/24 0430  WBC 9.4  --   --  34.8*  --  17.9*  NEUTROABS 2.5  --   --  31.2*  --  12.7*  HGB 16.0 16.0 13.3 14.5 13.9 13.5  HCT 48.8 47.0 39.0 41.4 41.0 38.4*  MCV 97.6  --   --  93.0  --  91.0  PLT 305  --   --  304  --  196    Basic Metabolic Panel: Recent Labs  Lab 07/04/24 2045 07/05/24 0007 07/05/24 0240 07/05/24 0341 07/05/24 0500 07/05/24 1030 07/05/24 1151 07/06/24 0430 07/06/24 0722  NA 134*   < >  --  138 136 135 136  --  131*  K 3.5   < >  --  3.3* 3.3* 3.7 3.7  --  4.0  CL 98  --   --   --  104  --  103  --  105  CO2 18*  --   --   --  21*  --  21*  --  20*  GLUCOSE 306*  --   --   --  290*  --  209*  --  107*  BUN 13  --   --   --  18  --  22*  --  19   CREATININE 1.33*  --   --   --  1.24  --  1.71*  --  0.91  CALCIUM 8.4*  --   --   --  7.1*  --  7.2*  --  7.1*  MG  --   --  1.7  --   --   --   --  2.2  --   PHOS  --   --  3.0  --   --   --   --  4.3  --    < > = values in this interval not displayed.   GFR: Estimated Creatinine Clearance: 110 mL/min (by C-G formula based on SCr of 0.91 mg/dL). Recent Labs  Lab 07/04/24 2045 07/04/24 2352 07/05/24 0240 07/05/24 0500 07/06/24 0430  WBC 9.4  --   --  34.8* 17.9*  LATICACIDVEN 8.4* 2.3* 1.8  --   --     Liver Function Tests: Recent Labs  Lab 07/04/24 2045 07/05/24 0500  AST 90* 118*  ALT 80* 103*  ALKPHOS 124 91  BILITOT 0.7 0.4  PROT 5.6* 4.6*  ALBUMIN  3.3* 2.9*   No results for input(s): LIPASE, AMYLASE in the last 168 hours. No results for input(s): AMMONIA in the last 168 hours.  ABG    Component Value Date/Time   PHART 7.250 (L) 07/05/2024 1030   PCO2ART 43.5 07/05/2024 1030   PO2ART 110 (H) 07/05/2024 1030   HCO3 18.8 (L) 07/05/2024 1030   TCO2 20 (L) 07/05/2024 1030   ACIDBASEDEF 8.0 (H) 07/05/2024 1030   O2SAT 97 07/05/2024 1030     Coagulation Profile: No results for input(s): INR, PROTIME in the last 168 hours.  Cardiac Enzymes: Recent Labs  Lab 07/05/24 0001 07/05/24 0500  CKTOTAL 734* 804*    HbA1C: No results found for: HGBA1C  CBG: Recent Labs  Lab 07/05/24 1710 07/05/24 1959 07/05/24 2314 07/06/24 0304 07/06/24 0803  GLUCAP 82 142* 109* 130* 99    Review of Systems:   Unobtainable 2/2 intubated and sedated status  Past Medical History:  He,  has a past medical history of Seizures (HCC).   Surgical History:  No past surgical history on file.   Social History:   reports that he has been smoking. He has never used smokeless tobacco. He reports current alcohol use.  Drug: Marijuana.   Family History:  His family history is not on file.   Allergies No Known Allergies   Home Medications  Prior to  Admission medications   Medication Sig Start Date End Date Taking? Authorizing Provider  amoxicillin  (AMOXIL ) 500 MG capsule Take 1 capsule (500 mg total) by mouth 3 (three) times daily. 11/25/21   Pollina, Christopher J, MD  amoxicillin -clavulanate (AUGMENTIN ) 875-125 MG tablet Take 1 tablet by mouth every 12 (twelve) hours. 08/13/23   Stuart Vernell Norris, PA-C  HYDROcodone -acetaminophen  (NORCO/VICODIN) 5-325 MG tablet Take 1 tablet by mouth every 4 (four) hours as needed. 02/25/16   Idol, Julie, PA-C  naproxen  (NAPROSYN ) 500 MG tablet Take 1 tablet (500 mg total) by mouth 2 (two) times daily as needed. 09/26/19   Rancour, Garnette, MD  phenytoin  (DILANTIN ) 100 MG ER capsule Take 200-300 mg by mouth 2 (two) times daily. Take 2 capsules in the morning and 3 capsules at night.    [provider]  phenytoin  (DILANTIN ) 100 MG ER capsule Take by mouth.    [provider]  predniSONE  (DELTASONE ) 20 MG tablet Take 2 tablets (40 mg total) by mouth daily with breakfast. 11/25/21   Haze Lonni PARAS, MD  predniSONE  (DELTASONE ) 20 MG tablet Take 2 tablets (40 mg total) by mouth daily with breakfast. 08/13/23   Stuart Vernell Norris, PA-C     Critical care time:    The patient is critically ill due to cardiovascular collapse currently needing active titration of vasopressors, acute on chronic hypoxic respiratory failure with active vent management .  Critical care was necessary to treat or prevent imminent or life-threatening deterioration. Critical care time was  spent by me on the following activities: development of a treatment plan with the patient and/or surrogate as well as nursing, discussions with consultants, evaluation of the patient's response to treatment, examination of the patient, obtaining a history from the patient or surrogate, ordering and performing treatments and interventions, ordering and review of laboratory studies, ordering and review of radiographic studies,  review of telemetry data including pulse oximetry, re-evaluation of patient's condition and participation in multidisciplinary rounds.   I personally spent 36 minutes providing critical care not including any separately billable procedures.   Zola LOISE Herter, MD [facility] 07/06/2024 10:47 AM

## 2024-07-06 NOTE — Progress Notes (Signed)
EEG maint complete.  ?

## 2024-07-07 DIAGNOSIS — R579 Shock, unspecified: Secondary | ICD-10-CM

## 2024-07-07 DIAGNOSIS — R569 Unspecified convulsions: Secondary | ICD-10-CM

## 2024-07-07 DIAGNOSIS — I468 Cardiac arrest due to other underlying condition: Secondary | ICD-10-CM | POA: Diagnosis not present

## 2024-07-07 DIAGNOSIS — I214 Non-ST elevation (NSTEMI) myocardial infarction: Secondary | ICD-10-CM

## 2024-07-07 DIAGNOSIS — J9601 Acute respiratory failure with hypoxia: Secondary | ICD-10-CM | POA: Diagnosis not present

## 2024-07-07 DIAGNOSIS — S069X0A Unspecified intracranial injury without loss of consciousness, initial encounter: Secondary | ICD-10-CM | POA: Diagnosis not present

## 2024-07-07 DIAGNOSIS — G40901 Epilepsy, unspecified, not intractable, with status epilepticus: Secondary | ICD-10-CM | POA: Diagnosis not present

## 2024-07-07 DIAGNOSIS — T782XXA Anaphylactic shock, unspecified, initial encounter: Secondary | ICD-10-CM | POA: Diagnosis not present

## 2024-07-07 LAB — BASIC METABOLIC PANEL WITH GFR
Anion gap: 11 (ref 5–15)
BUN: 26 mg/dL — ABNORMAL HIGH (ref 6–20)
CO2: 22 mmol/L (ref 22–32)
Calcium: 7.8 mg/dL — ABNORMAL LOW (ref 8.9–10.3)
Chloride: 101 mmol/L (ref 98–111)
Creatinine, Ser: 1.72 mg/dL — ABNORMAL HIGH (ref 0.61–1.24)
GFR, Estimated: 46 mL/min — ABNORMAL LOW (ref >60)
Glucose, Bld: 142 mg/dL — ABNORMAL HIGH (ref 70–99)
Potassium: 4.8 mmol/L (ref 3.5–5.1)
Sodium: 134 mmol/L — ABNORMAL LOW (ref 135–145)

## 2024-07-07 LAB — GLUCOSE, CAPILLARY
Glucose-Capillary: 110 mg/dL — ABNORMAL HIGH (ref 70–99)
Glucose-Capillary: 139 mg/dL — ABNORMAL HIGH (ref 70–99)
Glucose-Capillary: 144 mg/dL — ABNORMAL HIGH (ref 70–99)
Glucose-Capillary: 153 mg/dL — ABNORMAL HIGH (ref 70–99)
Glucose-Capillary: 168 mg/dL — ABNORMAL HIGH (ref 70–99)
Glucose-Capillary: 190 mg/dL — ABNORMAL HIGH (ref 70–99)
Glucose-Capillary: 195 mg/dL — ABNORMAL HIGH (ref 70–99)
Glucose-Capillary: 214 mg/dL — ABNORMAL HIGH (ref 70–99)

## 2024-07-07 LAB — TRIGLYCERIDES: Triglycerides: 193 mg/dL — ABNORMAL HIGH (ref <150)

## 2024-07-07 LAB — MAGNESIUM: Magnesium: 2.1 mg/dL (ref 1.7–2.4)

## 2024-07-07 LAB — PHOSPHORUS: Phosphorus: 6 mg/dL — ABNORMAL HIGH (ref 2.5–4.6)

## 2024-07-07 MED ORDER — LABETALOL HCL 5 MG/ML IV SOLN
20.0000 mg | INTRAVENOUS | Status: DC | PRN
  Administered 2024-07-07 – 2024-07-08 (×4): 20 mg via INTRAVENOUS
  Filled 2024-07-07 (×4): qty 4

## 2024-07-07 MED ORDER — IPRATROPIUM-ALBUTEROL 0.5-2.5 (3) MG/3ML IN SOLN
3.0000 mL | RESPIRATORY_TRACT | Status: DC
  Administered 2024-07-07 – 2024-07-08 (×6): 3 mL via RESPIRATORY_TRACT
  Filled 2024-07-07 (×6): qty 3

## 2024-07-07 MED ORDER — LABETALOL HCL 5 MG/ML IV SOLN
10.0000 mg | Freq: Three times a day (TID) | INTRAVENOUS | Status: DC | PRN
  Administered 2024-07-07: 10 mg via INTRAVENOUS
  Filled 2024-07-07: qty 4

## 2024-07-07 MED ORDER — AMLODIPINE BESYLATE 10 MG PO TABS
10.0000 mg | ORAL_TABLET | Freq: Every day | ORAL | Status: DC
  Administered 2024-07-07 – 2024-07-09 (×3): 10 mg
  Filled 2024-07-07 (×3): qty 1

## 2024-07-07 MED ORDER — LABETALOL HCL 5 MG/ML IV SOLN: 10.0000 mg | INTRAVENOUS | Status: DC | PRN

## 2024-07-07 MED ORDER — HYDRALAZINE HCL 20 MG/ML IJ SOLN
10.0000 mg | INTRAMUSCULAR | Status: DC | PRN
  Administered 2024-07-08: 10 mg via INTRAVENOUS
  Filled 2024-07-07: qty 1

## 2024-07-07 NOTE — Progress Notes (Signed)
 NAME:  Dale Mason, MRN:  [id number], DOB:  06-14-1966, LOS: 3 ADMISSION DATE:  07/04/2024, CONSULTATION DATE:  07/04/24 REFERRING MD:  EDP, CHIEF COMPLAINT:  anaphylaxis   History of Present Illness:  58 yo male working outside today when he sustained multiple wasp stings. Pt has sustained wasp stings previously without issue. However after being stung for a second time on his head this evening he was having shortness of breath, feeling of weakness with difficulty walking and wobbling with near falls. EMS was called and upon arrival he was found unresponsive, diaphoretic with agonal breathing and bradycardia, no palpable pulse and ALCS was started, airway established in the field. Pt has multiple rounds of epi between home and hospital pea and asystole at pulse checks, ROSC ultimately achieved and pt was started in epi infusion. EDP states he is adding levophed  at this time as well.   Cvc and arterial line were placed. Labs revealed acidosis, transaminitis and aki. CCM was asked for admission in light of the above.   All history is obtained from chart review, ROS unobtainable 2/2 intubated status.   Upon arrival to [facility] ICU, patient noted to have myoclonic jerking/seizure activity. STAT Ativan  2mg  given ordered by Ema CCM MD Mohon CT-scan of head obtained- no intracranial abnormality noted. Patient's most recent ABG- PH 7.138, Pco2  Update: pt began having seizure like activity. Last sz noted per wife was 2007. He is diligent about his medications and has not missed a dose of dilantin . Eeg and neuro consult ordered. Dilantin  level sent.   Pertinent  Medical History  Sz d/o  Significant Hospital Events: Including procedures, antibiotic start and stop dates in addition to other pertinent events   Presented to [facility] with anaphylactic shock 8/3 Transferred to [facility] for admission to ICU 8/3  Interim History / Subjective:   Objective    Blood pressure (!) 145/83, pulse 90, temperature 100.3 F (37.9  C), resp. rate (!) 28, height 6' 4 (1.93 m), weight 90.3 kg, SpO2 100%.    Vent Mode: PRVC FiO2 (%):  [40 %] 40 % Set Rate:  [28 bmp] 28 bmp Vt Set:  [690 mL] 690 mL PEEP:  [5 cmH20] 5 cmH20 Plateau Pressure:  [15 cmH20-19 cmH20] 19 cmH20   Intake/Output Summary (Last 24 hours) at 07/07/2024 0852 Last data filed at 07/07/2024 0800 Gross per 24 hour  Intake 1755.04 ml  Output 2140 ml  Net -384.96 ml   Filed Weights   07/04/24 2117 07/07/24 0424  Weight: 90.7 kg 90.3 kg    Examination: General: unresponsive on vent, seizure like activity ongoing HENT: ncat, eyelids rhythmic fluttering, mm dry and pale Lungs: coarse bilaterally Cardiovascular: rrr Abdomen: soft, nd bs+ Extremities: no c/c/e Neuro: unresponsive, rhythmic jerking of legs and fluttering of eye lids. Not following commands.  GU: deferred.   Resolved problem list   Assessment and Plan   38 M with a past medical history of seizures post traumatic head injury, presenting after being stung by multiple wasps with presentation concerning for cardiac arrest. He was noted to have convulsions and stiff extrememties followed by unresponsiveness with no palpable pulse. ICU evaluation with concern for anaphylactic shock vs septic shock.   Neuro #Seizures  #Hx of head trauma  #Toxic metabolic encephalopathy #Sedated due to seizures  Continue propofol  , versed  , dilantin  per neuro, keppra  per neuro  - Continue cEEG : flat EEG per neuro with burst supression, weaning sedation per neuro  - Following med levels  Cardiovascular  Anaphylactic shock vs septic shock  Type II NSTEMI  Off pressors today  Less concern for cardiogenic shock with normal EF and RV function  Spiked a temp of 38 today and continues on pressors. Concern for sepsis at this point, started CTX for possible aspiration even during episode of unresponsiveness. Plan for a 5 day course  - blood cultures so far no growth  - Troponin elevation likely due to  demand but will recheck now. TTE reassuring  - Stop steroids - D/C Pressors  - Stop H1 blocker  - Start labetalol  10mg  Q8h PRN for systolic > 180   Pulmonary  #Acute hypoxic respiratory failure #Bullous emphysema  Currently on low vent settings  Continue vent ACVC with TV 6-8 -Duo nebs Q4h  GI  #transaminits (likely due to shock) - Continue tube feeds  - Continue PPI   Renal  #AKI   #metabolic acidosis  #hyperkalemia  Likely prerenal/ Renal due to shock and possible ATN  - monitor Cr, avoid nephrotoxins    Endo  #Hyperglycemia  SSI, switched to drip and back to SSI today due to uncontrolled hyperglycemia   Onc  Continue lovenox    ID  Continue CTX for 5 days      Best Practice (right click and Reselect all SmartList Selections daily)   Diet/type: Tube feeds  DVT prophylaxis Lovenox  Pressure ulcer(s): N/A GI prophylaxis:  PPI  Lines: Central line  Foley:  Yes, and it is still needed Code Status:  full code Last date of multidisciplinary goals of care discussion [--]  Labs   CBC: Recent Labs  Lab 07/04/24 2045 07/05/24 0007 07/05/24 0341 07/05/24 0500 07/05/24 1030 07/06/24 0430 07/06/24 1146  WBC 9.4  --   --  34.8*  --  17.9*  --   NEUTROABS 2.5  --   --  31.2*  --  12.7*  --   HGB 16.0   < > 13.3 14.5 13.9 13.5 12.2*  HCT 48.8   < > 39.0 41.4 41.0 38.4* 36.0*  MCV 97.6  --   --  93.0  --  91.0  --   PLT 305  --   --  304  --  196  --    < > = values in this interval not displayed.    Basic Metabolic Panel: Recent Labs  Lab 07/05/24 0240 07/05/24 0341 07/05/24 0500 07/05/24 1030 07/05/24 1151 07/06/24 0430 07/06/24 0722 07/06/24 1146 07/06/24 1603 07/07/24 0340  NA  --    < > 136   < > 136  --  131* 133* 133* 134*  K  --    < > 3.3*   < > 3.7  --  4.0 5.5* 4.5 4.8  CL  --   --  104  --  103  --  105  --  100 101  CO2  --   --  21*  --  21*  --  20*  --  23 22  GLUCOSE  --   --  290*  --  209*  --  107*  --  139* 142*  BUN  --    --  18  --  22*  --  19  --  17 26*  CREATININE  --   --  1.24  --  1.71*  --  0.91  --  0.95 1.72*  CALCIUM  --   --  7.1*  --  7.2*  --  7.1*  --  7.4*  7.8*  MG 1.7  --   --   --   --  2.2  --   --   --  2.1  PHOS 3.0  --   --   --   --  4.3  --   --   --  6.0*   < > = values in this interval not displayed.   GFR: Estimated Creatinine Clearance: 58.2 mL/min (A) (by C-G formula based on SCr of 1.72 mg/dL (H)). Recent Labs  Lab 07/04/24 2045 07/04/24 2352 07/05/24 0240 07/05/24 0500 07/06/24 0430  WBC 9.4  --   --  34.8* 17.9*  LATICACIDVEN 8.4* 2.3* 1.8  --   --     Liver Function Tests: Recent Labs  Lab 07/04/24 2045 07/05/24 0500  AST 90* 118*  ALT 80* 103*  ALKPHOS 124 91  BILITOT 0.7 0.4  PROT 5.6* 4.6*  ALBUMIN  3.3* 2.9*   No results for input(s): LIPASE, AMYLASE in the last 168 hours. No results for input(s): AMMONIA in the last 168 hours.  ABG    Component Value Date/Time   PHART 7.343 (L) 07/06/2024 1146   PCO2ART 42.5 07/06/2024 1146   PO2ART 55 (L) 07/06/2024 1146   HCO3 23.2 07/06/2024 1146   TCO2 24 07/06/2024 1146   ACIDBASEDEF 3.0 (H) 07/06/2024 1146   O2SAT 87 07/06/2024 1146     Coagulation Profile: No results for input(s): INR, PROTIME in the last 168 hours.  Cardiac Enzymes: Recent Labs  Lab 07/05/24 0001 07/05/24 0500  CKTOTAL 734* 804*    HbA1C: No results found for: HGBA1C  CBG: Recent Labs  Lab 07/06/24 1505 07/06/24 2004 07/07/24 0005 07/07/24 0344 07/07/24 0803  GLUCAP 109* 130* 110* 139* 153*    Review of Systems:   Unobtainable 2/2 intubated and sedated status  Past Medical History:  He,  has a past medical history of Seizures (HCC).   Surgical History:  No past surgical history on file.   Social History:   reports that he has been smoking. He has never used smokeless tobacco. He reports current alcohol use.  Drug: Marijuana.   Family History:  His family history is not on file.    Allergies No Known Allergies   Home Medications  Prior to Admission medications   Medication Sig Start Date End Date Taking? Authorizing Provider  amoxicillin  (AMOXIL ) 500 MG capsule Take 1 capsule (500 mg total) by mouth 3 (three) times daily. 11/25/21   Haze Lonni PARAS, MD  amoxicillin -clavulanate (AUGMENTIN ) 875-125 MG tablet Take 1 tablet by mouth every 12 (twelve) hours. 08/13/23   Stuart Vernell Norris, PA-C  HYDROcodone -acetaminophen  (NORCO/VICODIN) 5-325 MG tablet Take 1 tablet by mouth every 4 (four) hours as needed. 02/25/16   Idol, Julie, PA-C  naproxen  (NAPROSYN ) 500 MG tablet Take 1 tablet (500 mg total) by mouth 2 (two) times daily as needed. 09/26/19   Rancour, Garnette, MD  phenytoin  (DILANTIN ) 100 MG ER capsule Take 200-300 mg by mouth 2 (two) times daily. Take 2 capsules in the morning and 3 capsules at night.    [provider]  phenytoin  (DILANTIN ) 100 MG ER capsule Take by mouth.    [provider]  predniSONE  (DELTASONE ) 20 MG tablet Take 2 tablets (40 mg total) by mouth daily with breakfast. 11/25/21   Haze Lonni PARAS, MD  predniSONE  (DELTASONE ) 20 MG tablet Take 2 tablets (40 mg total) by mouth daily with breakfast. 08/13/23   Stuart Vernell Norris, PA-C     Critical  care time:    The patient is critically ill due to cardiovascular collapse currently needing active titration of vasopressors, acute on chronic hypoxic respiratory failure with active vent management .  Critical care was necessary to treat or prevent imminent or life-threatening deterioration. Critical care time was spent by me on the following activities: development of a treatment plan with the patient and/or surrogate as well as nursing, discussions with consultants, evaluation of the patient's response to treatment, examination of the patient, obtaining a history from the patient or surrogate, ordering and performing treatments and interventions, ordering and review  of laboratory studies, ordering and review of radiographic studies, review of telemetry data including pulse oximetry, re-evaluation of patient's condition and participation in multidisciplinary rounds.   I personally spent 36 minutes providing critical care not including any separately billable procedures.   Zola LOISE Herter, MD [facility] 07/07/2024 8:52 AM

## 2024-07-07 NOTE — Progress Notes (Signed)
 NEUROLOGY CONSULT FOLLOW UP NOTE   Date of service: July 07, 2024 Patient Name: Dale Mason MRN:  [id number] DOB:  08/15/1966  Interval Hx/subjective   Seen and examined Remained burst suppressed overnight. Vitals   Vitals:   07/07/24 0700 07/07/24 0730 07/07/24 0800 07/07/24 0810  BP: (!) 150/79 (!) 153/83 (!) 145/83   Pulse: 87 87 90 90  Resp: (!) 28 (!) 28 (!) 28 (!) 28  Temp:  100.1 F (37.8 C) 100.3 F (37.9 C)   TempSrc:  Bladder    SpO2: 100% 100% 100% 100%  Weight:      Height:         Body mass index is 24.23 kg/m.  Physical Exam  General: Heavily sedated, intubated HEENT: Normocephalic atraumatic Lungs: Vented Cardiovascular: RRR Neurological exam Heavily sedated and intubated Pupils sluggishly reactive Corneal reflexes absent Very weak cough and gag No spontaneous movement No movement to noxious stimulation   Medications  Current Facility-Administered Medications:    acetaminophen  (TYLENOL ) tablet 500 mg, 500 mg, Per Tube, Q6H PRN, Amoako, Prince, MD, 500 mg at 07/05/24 1200   cefTRIAXone  (ROCEPHIN ) 2 g in sodium chloride  0.9 % 100 mL IVPB, 2 g, Intravenous, Q24H, Hattar, Zola SAILOR, MD, Paused at 07/06/24 0955   Chlorhexidine  Gluconate Cloth 2 % PADS 6 each, 6 each, Topical, Daily, Layman Raisin, DO, 6 each at 07/06/24 0926   docusate (COLACE) 50 MG/5ML liquid 100 mg, 100 mg, Per Tube, BID, Merilee Linsey I, RPH, 100 mg at 07/06/24 2120   enoxaparin  (LOVENOX ) injection 40 mg, 40 mg, Subcutaneous, Q24H, Hattar, Zola SAILOR, MD, 40 mg at 07/06/24 1436   feeding supplement (PROSource TF20) liquid 60 mL, 60 mL, Per Tube, Daily, Hattar, Zola SAILOR, MD, 60 mL at 07/06/24 1249   feeding supplement (VITAL 1.5 CAL) liquid 1,000 mL, 1,000 mL, Per Tube, Continuous, Hattar, Zola SAILOR, MD, Last Rate: 45 mL/hr at 07/07/24 0800, Infusion Verify at 07/07/24 0800   fentaNYL  (SUBLIMAZE ) bolus via infusion 25-100 mcg, 25-100 mcg, Intravenous, Q15 min PRN, Merilee Linsey  I, [facility]   fentaNYL  in NS (38mcg/ml) infusion-PREMIX, 0-400 mcg/hr, Intravenous, Continuous, Cleotilde Rogue, MD, Last Rate: 7.5 mL/hr at 07/07/24 0800, 75 mcg/hr at 07/07/24 0800   insulin  aspart (novoLOG ) injection 0-15 Units, 0-15 Units, Subcutaneous, Q4H, Hattar, Zola SAILOR, MD, 3 Units at 07/07/24 9187   ipratropium-albuterol  (DUONEB) 0.5-2.5 (3) MG/3ML nebulizer solution 3 mL, 3 mL, Nebulization, Q4H, Hattar, Zola SAILOR, MD   labetalol  (NORMODYNE ) injection 10 mg, 10 mg, Intravenous, Q8H PRN, Hattar, Zola SAILOR, MD   lacosamide  (VIMPAT ) 200 mg in sodium chloride  0.9 % 25 mL IVPB, 200 mg, Intravenous, Q12H, Tyrrell Stephens, MD, Stopped at 07/06/24 2237   levETIRAcetam  (KEPPRA ) undiluted injection 1,500 mg, 1,500 mg, Intravenous, Q12H, Michaela Aisha SQUIBB, MD, 1,500 mg at 07/06/24 2118   midazolam  (VERSED ) 100 mg/100 mL (1 mg/mL) premix infusion, 10 mg/hr, Intravenous, Continuous, Voncile Isles, MD, Last Rate: 10 mL/hr at 07/07/24 0800, 10 mg/hr at 07/07/24 0800   Oral care mouth rinse, 15 mL, Mouth Rinse, Q2H, Marshall, Jessica, DO, 15 mL at 07/07/24 0732   Oral care mouth rinse, 15 mL, Mouth Rinse, PRN, Layman Raisin, DO   pantoprazole  (PROTONIX ) injection 40 mg, 40 mg, Intravenous, Q24H, Hattar, Zola SAILOR, MD   phenytoin  (DILANTIN ) 150 mg in sodium chloride  0.9 % 25 mL IVPB, 150 mg, Intravenous, BID, Michaela Aisha SQUIBB, MD, Stopped at 07/07/24 9370   phenytoin  (DILANTIN ) 200 mg in sodium chloride  0.9 % 25  mL IVPB, 200 mg, Intravenous, Q1400, Michaela Aisha SQUIBB, MD, Stopped at 07/06/24 1544   polyethylene glycol (MIRALAX  / GLYCOLAX ) packet 17 g, 17 g, Per Tube, Daily, Merilee Linsey I, RPH, 17 g at 07/06/24 1249   propofol  (DIPRIVAN ) 1000 MG/100ML infusion, 20 mcg/kg/min, Intravenous, Continuous, Voncile Isles, MD, Last Rate: 10.88 mL/hr at 07/07/24 0800, 20 mcg/kg/min at 07/07/24 0800  Labs and Diagnostic Imaging   CBC:  Recent Labs  Lab 07/05/24 0500 07/05/24 1030  07/06/24 0430 07/06/24 1146  WBC 34.8*  --  17.9*  --   NEUTROABS 31.2*  --  12.7*  --   HGB 14.5   < > 13.5 12.2*  HCT 41.4   < > 38.4* 36.0*  MCV 93.0  --  91.0  --   PLT 304  --  196  --    < > = values in this interval not displayed.    Basic Metabolic Panel:  Lab Results  Component Value Date   NA 134 (L) 07/07/2024   K 4.8 07/07/2024   CO2 22 07/07/2024   GLUCOSE 142 (H) 07/07/2024   BUN 26 (H) 07/07/2024   CREATININE 1.72 (H) 07/07/2024   CALCIUM 7.8 (L) 07/07/2024   GFRNONAA 46 (L) 07/07/2024   AED levels:  Lab Results  Component Value Date   PHENYTOIN  14.4 07/05/2024    CT Head without contrast(Personally reviewed): No acute process   Continuous video EEG  This is an abnormal EEG due to the presence of complete suppression, suggesting severe encephalopathy, which is nonspecific as to etiology (medication effect of propofol  and benzodiazepine could not be ruled out). No epileptiform discharge or seizure is present. .  Assessment   Dale Mason is a 58 y.o. 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  He complained of not feeling well and asked his wife to call 911 after the wasp stings and became unresponsive.  On EMS arrival he was once responsive and diaphoretic with no palpable pulse.  Airway established in the field had multiple rounds of epi during the CPR.  After transferred from [facility] it was noted that he had continuous stitching of the eyelid concerning for ongoing seizures.  He was increased on propofol  that he was at at [facility] and 75 mcg/kg/min to 100. Versed  10 mg followed by 5 mg/h Versed  drip.  He was continued on Dilantin  and started on Keppra  after load. Rapid EEG was started which was changed to regular EEG which showed continuing epileptiform discharges and increasing frequency of burst  Eventually on Keppra , Vimpat  and phenytoin .   Kept on Versed  and propofol  drips for burst suppression.  He has been adequately suppressed for 24 hours.  I would recommend starting weaning off sedation while continuing LTM EEG.  Impression: Breakthrough seizure with status epilepticus likely provoked by wasp bite-eventually leading to cardiac arrest  Recommendations  Continue Keppra  1500 twice daily, Vimpat  200 twice daily, phenytoin  150 twice daily. Currently on propofol  20.  Reduce by 10 every hour and discontinue Currently at Versed  at 10 mg/h.  Decrease by 1 mg/h over the next 10 hours. Continue LTM EEG Will follow   Plan discussed with Dr. Zaida ______________________________________________________________________   Signed, Isles Voncile, MD [facility]   CRITICAL CARE ATTESTATION Performed by: Isles Voncile, MD Total critical care time: 40 minutes Critical care time was exclusive of separately billable procedures and treating other patients and/or  supervising APPs/Residents/Students Critical care was necessary to treat or prevent imminent or life-threatening deterioration. This patient is critically ill and at significant risk for neurological worsening and/or death and care requires constant monitoring. Critical care was time spent personally by me on the following activities: development of treatment plan with patient and/or surrogate as well as nursing, discussions with consultants, evaluation of patient's response to treatment, examination of patient, obtaining history from patient or surrogate, ordering and performing treatments and interventions, ordering and review of laboratory studies, ordering and review of radiographic studies, pulse oximetry, re-evaluation of patient's condition, participation in multidisciplinary rounds and medical decision making of high complexity in the care of this patient.

## 2024-07-07 NOTE — Plan of Care (Signed)
  Problem: Clinical Measurements: Goal: Ability to maintain clinical measurements within normal limits will improve Outcome: Progressing Goal: Will remain free from infection Outcome: Progressing Goal: Diagnostic test results will improve Outcome: Progressing Goal: Respiratory complications will improve Outcome: Progressing Goal: Cardiovascular complication will be avoided Outcome: Progressing   Problem: Activity: Goal: Risk for activity intolerance will decrease Outcome: Progressing   Problem: Nutrition: Goal: Adequate nutrition will be maintained Outcome: Progressing   Problem: Coping: Goal: Level of anxiety will decrease Outcome: Progressing   Problem: Elimination: Goal: Will not experience complications related to bowel motility Outcome: Progressing Goal: Will not experience complications related to urinary retention Outcome: Progressing   Problem: Pain Managment: Goal: General experience of comfort will improve and/or be controlled Outcome: Progressing   Problem: Safety: Goal: Ability to remain free from injury will improve Outcome: Progressing   Problem: Skin Integrity: Goal: Risk for impaired skin integrity will decrease Outcome: Progressing   Problem: Education: Goal: Knowledge of General Education information will improve Description: Including pain rating scale, medication(s)/side effects and non-pharmacologic comfort measures Outcome: Not Progressing   Problem: Health Behavior/Discharge Planning: Goal: Ability to manage health-related needs will improve Outcome: Not Progressing

## 2024-07-07 NOTE — Progress Notes (Signed)
 eLink Physician-Brief Progress Note Patient Name: Dale Mason DOB: 1966/05/23 MRN: [id number]   Date of Service  07/07/2024  HPI/Events of Note  Hypertensive to the 190s, increasing since sedation was turned off earlier today.  Preserved EF, minimal improvement with as needed labetalol .  Having body tremors with EEG in place  eICU Interventions  Add amlodipine  per tube Increase frequency of labetalol , dose Add second line hydralazine  EEG in place   0631 -audible cuff leak, but ventilator waveforms seem appropriate, oxygenating and ventilating appropriately.  Last radiograph showing ET tube 9.3 cm above the carina, ET tube was never adjusted in the documentation.  Advanced 3 cm, will repeat chest radiograph now.      Jaye Polidori 07/07/2024, 10:15 PM

## 2024-07-07 NOTE — Procedures (Signed)
 Continuous Video-EEG Monitoring Report  CPT/Type of Study: 95720 (EEG with video, 12-26 hours)      Indications for Procedure: Altered mental status and seizure activity Primary neurological diagnosis: G93.40; R56.9  Duration of study: 07/06/2024 07:30 to 07/07/2024 07:13  History: This is a 58 year old male presenting with altered mental status and seizure activity in the context of anaphylactic shock. Continuous video-EEG monitoring was performed to evaluate for seizure.   Technical Description: The EEG was performed using standard setting per the guidelines of American Clinical Neurophysiology Society (ACNS). A minimum of 21 electrodes were placed on scalp according to the International 10-20 or/and 10-10 Systems. Supplemental electrodes were placed as needed. Single EKG electrode was also used to detect cardiac arrhythmia. Patient's behavior was continuously recorded on video simultaneously with EEG. A minimum of 16 channels were used for data display. Each epoch of study was reviewed manually daily and as needed using standard referential and bipolar montages. Computerized quantitative EEG analysis (such as compressed spectral array analysis, trending, automated spike & seizure detection) were used as indicated.   EEG Description:  Epoch: 07/06/2024 07:30 to 07/07/2024 07:13  Background: There was complete suppression. No posterior dominant rhythm or sleep architecture was recorded.   PERIODIC OR RHYTHMIC PATTERNS: None.   EPILEPTIFORM DISCHARGES: None.   SEIZURES: None.  EVENTS: None.  EKG: No reliable data.   Impression:  This is an abnormal EEG due to the presence of complete suppression, suggesting severe encephalopathy, which is nonspecific as to etiology (medication effect of propofol  and benzodiazepine could not be ruled out). No epileptiform discharge or seizure is present.

## 2024-07-08 ENCOUNTER — Inpatient Hospital Stay (HOSPITAL_COMMUNITY)

## 2024-07-08 DIAGNOSIS — I214 Non-ST elevation (NSTEMI) myocardial infarction: Secondary | ICD-10-CM | POA: Diagnosis not present

## 2024-07-08 DIAGNOSIS — G40901 Epilepsy, unspecified, not intractable, with status epilepticus: Secondary | ICD-10-CM | POA: Diagnosis not present

## 2024-07-08 DIAGNOSIS — J9601 Acute respiratory failure with hypoxia: Secondary | ICD-10-CM | POA: Diagnosis not present

## 2024-07-08 DIAGNOSIS — I468 Cardiac arrest due to other underlying condition: Secondary | ICD-10-CM | POA: Diagnosis not present

## 2024-07-08 DIAGNOSIS — R579 Shock, unspecified: Secondary | ICD-10-CM | POA: Diagnosis not present

## 2024-07-08 DIAGNOSIS — R569 Unspecified convulsions: Secondary | ICD-10-CM | POA: Diagnosis not present

## 2024-07-08 DIAGNOSIS — T782XXA Anaphylactic shock, unspecified, initial encounter: Secondary | ICD-10-CM | POA: Diagnosis not present

## 2024-07-08 DIAGNOSIS — S069X0A Unspecified intracranial injury without loss of consciousness, initial encounter: Secondary | ICD-10-CM | POA: Diagnosis not present

## 2024-07-08 LAB — MAGNESIUM: Magnesium: 2.2 mg/dL (ref 1.7–2.4)

## 2024-07-08 LAB — COMPREHENSIVE METABOLIC PANEL WITH GFR
ALT: 41 U/L (ref 0–44)
AST: 44 U/L — ABNORMAL HIGH (ref 15–41)
Albumin: 2.4 g/dL — ABNORMAL LOW (ref 3.5–5.0)
Alkaline Phosphatase: 74 U/L (ref 38–126)
Anion gap: 7 (ref 5–15)
BUN: 15 mg/dL (ref 6–20)
CO2: 27 mmol/L (ref 22–32)
Calcium: 8.4 mg/dL — ABNORMAL LOW (ref 8.9–10.3)
Chloride: 105 mmol/L (ref 98–111)
Creatinine, Ser: 0.6 mg/dL — ABNORMAL LOW (ref 0.61–1.24)
GFR, Estimated: >60 mL/min (ref >60)
Glucose, Bld: 208 mg/dL — ABNORMAL HIGH (ref 70–99)
Potassium: 3.6 mmol/L (ref 3.5–5.1)
Sodium: 139 mmol/L (ref 135–145)
Total Bilirubin: 0.4 mg/dL (ref 0.0–1.2)
Total Protein: 5.3 g/dL — ABNORMAL LOW (ref 6.5–8.1)

## 2024-07-08 LAB — GLUCOSE, CAPILLARY
Glucose-Capillary: 132 mg/dL — ABNORMAL HIGH (ref 70–99)
Glucose-Capillary: 136 mg/dL — ABNORMAL HIGH (ref 70–99)
Glucose-Capillary: 143 mg/dL — ABNORMAL HIGH (ref 70–99)
Glucose-Capillary: 148 mg/dL — ABNORMAL HIGH (ref 70–99)
Glucose-Capillary: 168 mg/dL — ABNORMAL HIGH (ref 70–99)
Glucose-Capillary: 217 mg/dL — ABNORMAL HIGH (ref 70–99)
Glucose-Capillary: 239 mg/dL — ABNORMAL HIGH (ref 70–99)

## 2024-07-08 LAB — TRIGLYCERIDES: Triglycerides: 37 mg/dL (ref <150)

## 2024-07-08 LAB — PHOSPHORUS: Phosphorus: 1.9 mg/dL — ABNORMAL LOW (ref 2.5–4.6)

## 2024-07-08 LAB — PHENYTOIN LEVEL, TOTAL: Phenytoin Lvl: 12.1 ug/mL (ref 10.0–20.0)

## 2024-07-08 MED ORDER — POTASSIUM & SODIUM PHOSPHATES 280-160-250 MG PO PACK
2.0000 | PACK | ORAL | Status: AC
  Administered 2024-07-08 (×4): 2
  Filled 2024-07-08 (×4): qty 2

## 2024-07-08 MED ORDER — IPRATROPIUM-ALBUTEROL 0.5-2.5 (3) MG/3ML IN SOLN
3.0000 mL | Freq: Four times a day (QID) | RESPIRATORY_TRACT | Status: DC | PRN
  Administered 2024-07-10: 3 mL via RESPIRATORY_TRACT
  Filled 2024-07-08: qty 3

## 2024-07-08 MED ORDER — FENTANYL CITRATE PF 50 MCG/ML IJ SOSY
25.0000 ug | PREFILLED_SYRINGE | Freq: Once | INTRAMUSCULAR | Status: DC | PRN
  Filled 2024-07-08: qty 1

## 2024-07-08 MED ORDER — FENTANYL CITRATE PF 50 MCG/ML IJ SOSY
PREFILLED_SYRINGE | INTRAMUSCULAR | Status: AC
  Filled 2024-07-08: qty 1

## 2024-07-08 NOTE — Progress Notes (Signed)
 Nutrition Follow-up  DOCUMENTATION CODES:  Not applicable  INTERVENTION:  Continue tube feeding via OGT: Vital 1.5 at 65 ml/h (1560 ml per day) Prosource TF20 60 ml x/d Provides 2420 kcal, 125 gm protein, 1192 ml free water daily  NUTRITION DIAGNOSIS:  Inadequate oral intake related to inability to eat as evidenced by NPO status. 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 EMS placed esophageal obturator airway in the field and CPR initiated when pt lost pulse.   8/3 - admitted to ED, intubated 8/5 - TF initiated  Patient remains intubated at this time. No longer on sedation but with poor neuro status. This AM, neurology noted exam with minimal brainstem function and no elicitable higher cortical function. I suspect severe anoxic brain injury in the setting of cardiac arrest  Pt discussed during ICU rounds and with RN and MD. Pt scheduled to have MRI this afternoon to further evaluate neurological status.   Tolerating TF at goal, will continue current regimen at this time.   MV: 19.1 L/min Temp (24hrs), Avg:98.7 F (37.1 C), Min:97.5 F (36.4 C), Max:100.4 F (38 C) MAP (cuff):  Admit weight: 90.7 kg  Current weight: 90.6 kg    Intake/Output Summary (Last 24 hours) at 07/08/2024 1034 Last data filed at 07/08/2024 1000 Gross per 24 hour  Intake 1716.12 ml  Output 2265 ml  Net -548.88 ml  Net IO Since Admission: 6,032.83 mL [07/08/24 1034]  Drains/Lines: CVC Triple Lumen right femoral Art Line (left radial) OGT 16 Fr.  UOP out x 24 hours  Nutritionally Relevant Medications: Scheduled Meds:  docusate  100 mg Per Tube BID    PROSource TF20  60 mL Per Tube Daily   insulin  aspart  0-15 Units Subcutaneous Q4H   pantoprazole  IV  40 mg Intravenous Q24H   polyethylene glycol  17 g Per Tube Daily   potassium & sodium phosphates   2 packet Per Tube Q4H   Continuous Infusions:  cefTRIAXone  (ROCEPHIN )  IV 2 g (07/08/24 0952)   feeding supplement (VITAL 1.5 CAL) 65 mL/hr at 07/08/24 0800   Labs Reviewed: creatinine 0.6 Phosphorus 1.9 CBG ranges from 110-214 mg/dL over the last 24 hours  NUTRITION - FOCUSED PHYSICAL EXAM: Flowsheet Row Most Recent Value  Orbital Region No depletion  Upper Arm Region No depletion  Thoracic and Lumbar Region No depletion  Buccal Region No depletion  Temple Region No depletion  Clavicle Bone Region No depletion  Clavicle and Acromion Bone Region No depletion  Scapular Bone Region No depletion  Dorsal Hand No depletion  Patellar Region No depletion  Anterior Thigh Region No depletion  Posterior Calf Region No depletion  Edema (RD Assessment) None  Hair Reviewed  Eyes Reviewed  Mouth Reviewed  Skin Reviewed  Nails Reviewed    Diet Order:   Diet Order             Diet NPO time specified  Diet effective now                   EDUCATION NEEDS:  Not appropriate for education at this time  Skin:  Skin Assessment: Reviewed RN Assessment  Last BM:  unsure  Height:  Ht Readings from Last 1 Encounters:  07/04/24 6' 4 (1.93 m)    Weight:  Wt Readings from Last 1 Encounters:  07/08/24 90.6 kg    Ideal Body Weight:  91.8 kg  BMI:  Body mass index is 24.31 kg/m.  Estimated Nutritional Needs:  Kcal:  2500-2700 kcal/d Protein:  125-140g/d Fluid:  2.5L/d    Vernell Lukes, RD, LDN, CNSC Registered Dietitian II Please reach out via secure chat

## 2024-07-08 NOTE — Procedures (Signed)
 Patient Name: Sayeed Weatherall  MRN: [id number]  Epilepsy Attending: Arlin MALVA Krebs  Referring Physician/Provider: Dr Eligio Lav Duration: 07/08/2024 9286 to 07/08/2024 1032   Patient history: 58 year old male presenting with altered mental status and seizure activity in the context of anaphylactic shock. Continuous video-EEG monitoring was performed to evaluate for seizure.    Level of alertness:  comatose   AEDs during EEG study: LEV, LCM, PHT   Technical aspects: This EEG study was done with scalp electrodes positioned according to the 10-20 International system of electrode placement. Electrical activity was reviewed with band pass filter of 1-70Hz , sensitivity of 7 uV/mm, display speed of 41mm/sec with a 60Hz  notched filter applied as appropriate. EEG data were recorded continuously and digitally stored.  Video monitoring was available and reviewed as appropriate.   Description: EEG showed continuous generalized background suppression. Hyperventilation and photic stimulation were not performed.      ABNORMALITY -Background suppression, generalized   IMPRESSION: This study is suggestive of severe to profound diffuse encephalopathy. No seizures or epileptiform discharges were seen throughout the recording.   Storey Stangeland O Shae Augello

## 2024-07-08 NOTE — Procedures (Signed)
 Patient Name: Dale Mason  MRN: [id number]  Epilepsy Attending: Arlin MALVA Krebs  Referring Physician/Provider: Dr Eligio Lav Duration: 07/07/2024 9286 to 07/08/2024 9286  Patient history: 58 year old male presenting with altered mental status and seizure activity in the context of anaphylactic shock. Continuous video-EEG monitoring was performed to evaluate for seizure.   Level of alertness:  comatose  AEDs during EEG study: LEV, LCM, PHT  Technical aspects: This EEG study was done with scalp electrodes positioned according to the 10-20 International system of electrode placement. Electrical activity was reviewed with band pass filter of 1-70Hz , sensitivity of 7 uV/mm, display speed of 39mm/sec with a 60Hz  notched filter applied as appropriate. EEG data were recorded continuously and digitally stored.  Video monitoring was available and reviewed as appropriate.  Description: EEG showed continuous generalized background suppression.   Event button was pressed on 07/07/2024 at 1938 for left chest twitching.  Concomitant EEG before, during and after the event did not show any EEG changes suggest seizure.  Hyperventilation and photic stimulation were not performed.     ABNORMALITY -Background suppression, generalized  IMPRESSION: This study is suggestive of severe to profound diffuse encephalopathy. No seizures or epileptiform discharges were seen throughout the recording.  Event button was pressed on 07/07/2024 at 1938 for left chest twitching without concomitant EEG change. This event was most likely not epileptic.   [name] [name]

## 2024-07-08 NOTE — Plan of Care (Signed)
  Problem: Education: Goal: Knowledge of General Education information will improve Description: Including pain rating scale, medication(s)/side effects and non-pharmacologic comfort measures Outcome: Progressing   Problem: Clinical Measurements: Goal: Will remain free from infection Outcome: Progressing   Problem: Health Behavior/Discharge Planning: Goal: Ability to manage health-related needs will improve Outcome: Not Progressing   Problem: Clinical Measurements: Goal: Ability to maintain clinical measurements within normal limits will improve Outcome: Not Progressing Goal: Diagnostic test results will improve Outcome: Not Progressing Goal: Respiratory complications will improve Outcome: Not Progressing Goal: Cardiovascular complication will be avoided Outcome: Not Progressing   Problem: Activity: Goal: Risk for activity intolerance will decrease Outcome: Not Progressing   Problem: Nutrition: Goal: Adequate nutrition will be maintained Outcome: Not Progressing   Problem: Elimination: Goal: Will not experience complications related to urinary retention Outcome: Not Progressing   Problem: Pain Managment: Goal: General experience of comfort will improve and/or be controlled Outcome: Not Progressing   Problem: Safety: Goal: Ability to remain free from injury will improve Outcome: Not Progressing   Problem: Skin Integrity: Goal: Risk for impaired skin integrity will decrease Outcome: Not Progressing   Problem: Education: Goal: Ability to describe self-care measures that may prevent or decrease complications (Diabetes Survival Skills Education) will improve Outcome: Not Progressing Goal: Individualized Educational Video(s) Outcome: Not Progressing   Problem: Coping: Goal: Ability to adjust to condition or change in health will improve Outcome: Not Progressing   Problem: Fluid Volume: Goal: Ability to maintain a balanced intake and output will improve Outcome:  Not Progressing   Problem: Health Behavior/Discharge Planning: Goal: Ability to identify and utilize available resources and services will improve Outcome: Not Progressing Goal: Ability to manage health-related needs will improve Outcome: Not Progressing   Problem: Metabolic: Goal: Ability to maintain appropriate glucose levels will improve Outcome: Not Progressing   Problem: Nutritional: Goal: Maintenance of adequate nutrition will improve Outcome: Not Progressing Goal: Progress toward achieving an optimal weight will improve Outcome: Not Progressing   Problem: Skin Integrity: Goal: Risk for impaired skin integrity will decrease Outcome: Not Progressing   Problem: Tissue Perfusion: Goal: Adequacy of tissue perfusion will improve Outcome: Not Progressing

## 2024-07-08 NOTE — Plan of Care (Signed)
  Problem: Clinical Measurements: Goal: Ability to maintain clinical measurements within normal limits will improve Outcome: Progressing Goal: Will remain free from infection Outcome: Progressing Goal: Respiratory complications will improve Outcome: Progressing Goal: Cardiovascular complication will be avoided Outcome: Progressing   Problem: Nutrition: Goal: Adequate nutrition will be maintained Outcome: Progressing   Problem: Safety: Goal: Ability to remain free from injury will improve Outcome: Progressing   Problem: Fluid Volume: Goal: Ability to maintain a balanced intake and output will improve Outcome: Progressing   Problem: Metabolic: Goal: Ability to maintain appropriate glucose levels will improve Outcome: Progressing   Problem: Education: Goal: Knowledge of General Education information will improve Description: Including pain rating scale, medication(s)/side effects and non-pharmacologic comfort measures Outcome: Not Progressing   Problem: Education: Goal: Ability to describe self-care measures that may prevent or decrease complications (Diabetes Survival Skills Education) will improve Outcome: Not Progressing   Problem: Coping: Goal: Ability to adjust to condition or change in health will improve Outcome: Not Progressing

## 2024-07-08 NOTE — Progress Notes (Signed)
 LTM VIDEO EEG discontinued - no skin breakdown at [facility]

## 2024-07-08 NOTE — Plan of Care (Signed)
  Problem: Education: Goal: Knowledge of General Education information will improve Description: Including pain rating scale, medication(s)/side effects and non-pharmacologic comfort measures Outcome: Progressing   Problem: Health Behavior/Discharge Planning: Goal: Ability to manage health-related needs will improve Outcome: Progressing   Problem: Clinical Measurements: Goal: Ability to maintain clinical measurements within normal limits will improve Outcome: Progressing Goal: Will remain free from infection Outcome: Progressing Goal: Diagnostic test results will improve Outcome: Progressing Goal: Respiratory complications will improve Outcome: Progressing Goal: Cardiovascular complication will be avoided Outcome: Progressing   Problem: Activity: Goal: Risk for activity intolerance will decrease Outcome: Progressing   Problem: Nutrition: Goal: Adequate nutrition will be maintained Outcome: Progressing   Problem: Elimination: Goal: Will not experience complications related to urinary retention Outcome: Progressing   Problem: Pain Managment: Goal: General experience of comfort will improve and/or be controlled Outcome: Progressing   Problem: Safety: Goal: Ability to remain free from injury will improve Outcome: Progressing   Problem: Skin Integrity: Goal: Risk for impaired skin integrity will decrease Outcome: Progressing   Problem: Education: Goal: Ability to describe self-care measures that may prevent or decrease complications (Diabetes Survival Skills Education) will improve Outcome: Progressing Goal: Individualized Educational Video(s) Outcome: Progressing   Problem: Coping: Goal: Ability to adjust to condition or change in health will improve Outcome: Progressing   Problem: Fluid Volume: Goal: Ability to maintain a balanced intake and output will improve Outcome: Progressing   Problem: Health Behavior/Discharge Planning: Goal: Ability to identify and  utilize available resources and services will improve Outcome: Progressing Goal: Ability to manage health-related needs will improve Outcome: Progressing   Problem: Metabolic: Goal: Ability to maintain appropriate glucose levels will improve Outcome: Progressing   Problem: Nutritional: Goal: Maintenance of adequate nutrition will improve Outcome: Progressing Goal: Progress toward achieving an optimal weight will improve Outcome: Progressing   Problem: Skin Integrity: Goal: Risk for impaired skin integrity will decrease Outcome: Progressing   Problem: Tissue Perfusion: Goal: Adequacy of tissue perfusion will improve Outcome: Progressing

## 2024-07-08 NOTE — Progress Notes (Addendum)
 NEUROLOGY CONSULT FOLLOW UP NOTE   Date of service: July 08, 2024 Patient Name: Dale Mason MRN:  [id number] DOB:  07/19/1966  Interval Hx/subjective  Sedation weaned off yesterday Seen and examined  Vitals   Vitals:   07/08/24 0730 07/08/24 0745 07/08/24 0800 07/08/24 0803  BP: (!) 176/108  118/67   Pulse: 84 70 69   Resp: (!) 27 (!) 28 (!) 28   Temp:    98.1 F (36.7 C)  TempSrc:      SpO2: 100% 100% 100%   Weight:      Height:         Body mass index is 24.31 kg/m.  Physical Exam  General: Well-developed well-nourished man in no acute distress, intubated, no sedation since last night HEENT: Normocephalic atraumatic Lungs: Vented Cardiovascular: Regular rate rhythm Neurological exam Intubated On no sedation Breathing with the ventilator No spontaneous movements No response to voice No response to noxious simulation/sternal rub Cranial nerves: Pupils are 2 mm, extremely sluggish reaction, corneal reflexes absent, cough and gag absent, breathing with the ventilator  Medications  Current Facility-Administered Medications:    acetaminophen  (TYLENOL ) tablet 500 mg, 500 mg, Per Tube, Q6H PRN, Amoako, Prince, MD, 500 mg at 07/05/24 1200   amLODipine  (NORVASC ) tablet 10 mg, 10 mg, Per Tube, Daily, Paliwal, Aditya, MD, 10 mg at 07/07/24 2234   cefTRIAXone  (ROCEPHIN ) 2 g in sodium chloride  0.9 % 100 mL IVPB, 2 g, Intravenous, Q24H, Hattar, Zola SAILOR, MD, Stopped at 07/07/24 0940   Chlorhexidine  Gluconate Cloth 2 % PADS 6 each, 6 each, Topical, Daily, Layman Raisin, DO, 6 each at 07/07/24 1020   docusate (COLACE) 50 MG/5ML liquid 100 mg, 100 mg, Per Tube, BID, Merilee Linsey I, RPH, 100 mg at 07/07/24 2106   enoxaparin  (LOVENOX ) injection 40 mg, 40 mg, Subcutaneous, Q24H, Hattar, Zola SAILOR, MD, 40 mg at 07/07/24 1405   feeding supplement (PROSource TF20) liquid 60 mL, 60 mL, Per Tube, Daily, Hattar, Zola SAILOR, MD, 60 mL at 07/07/24 0921   feeding supplement (VITAL 1.5  CAL) liquid 1,000 mL, 1,000 mL, Per Tube, Continuous, Hattar, Zola SAILOR, MD, Last Rate: 65 mL/hr at 07/08/24 0000, Rate Change at 07/08/24 0000   fentaNYL  (SUBLIMAZE ) bolus via infusion 25-100 mcg, 25-100 mcg, Intravenous, Q15 min PRN, Merilee Linsey I, RPH, 100 mcg at 07/08/24 0206   fentaNYL  in NS (94mcg/ml) infusion-PREMIX, 0-400 mcg/hr, Intravenous, Continuous, Cleotilde Rogue, MD, Stopped at 07/08/24 0208   hydrALAZINE  (APRESOLINE ) injection 10 mg, 10 mg, Intravenous, Q4H PRN, Paliwal, Aditya, MD, 10 mg at 07/08/24 0432   insulin  aspart (novoLOG ) injection 0-15 Units, 0-15 Units, Subcutaneous, Q4H, Hattar, Laith N, MD, 3 Units at 07/08/24 0818   ipratropium-albuterol  (DUONEB) 0.5-2.5 (3) MG/3ML nebulizer solution 3 mL, 3 mL, Nebulization, Q4H, Hattar, Laith N, MD, 3 mL at 07/08/24 0301   labetalol  (NORMODYNE ) injection 20 mg, 20 mg, Intravenous, Q2H PRN, Paliwal, Aditya, MD, 20 mg at 07/08/24 0736   lacosamide  (VIMPAT ) 200 mg in sodium chloride  0.9 % 25 mL IVPB, 200 mg, Intravenous, Q12H, Iliana Hutt, MD, Stopped at 07/07/24 2305   levETIRAcetam  (KEPPRA ) undiluted injection 1,500 mg, 1,500 mg, Intravenous, Q12H, Michaela Aisha SQUIBB, MD, 1,500 mg at 07/07/24 2106   midazolam  (VERSED ) 100 mg/100 mL (1 mg/mL) premix infusion, 0-10 mg/hr, Intravenous, Continuous, Voncile Isles, MD, Stopped at 07/07/24 2105   Oral care mouth rinse, 15 mL, Mouth Rinse, Q2H, Marshall, Jessica, DO, 15 mL at 07/08/24 9385   Oral care mouth rinse,  15 mL, Mouth Rinse, PRN, Layman Raisin, DO   pantoprazole  (PROTONIX ) injection 40 mg, 40 mg, Intravenous, Q24H, Hattar, Zola SAILOR, MD, 40 mg at 07/07/24 1111   phenytoin  (DILANTIN ) 150 mg in sodium chloride  0.9 % 25 mL IVPB, 150 mg, Intravenous, BID, Michaela Aisha SQUIBB, MD, Last Rate: 56 mL/hr at 07/08/24 0824, 150 mg at 07/08/24 0824   phenytoin  (DILANTIN ) 200 mg in sodium chloride  0.9 % 25 mL IVPB, 200 mg, Intravenous, Q1400, Michaela Aisha SQUIBB, MD,  Stopped at 07/07/24 1509   polyethylene glycol (MIRALAX  / GLYCOLAX ) packet 17 g, 17 g, Per Tube, Daily, Merilee Linsey I, RPH, 17 g at 07/07/24 0921   potassium & sodium phosphates  (PHOS-NAK) 280-160-250 MG packet 2 packet, 2 packet, Per Tube, Q4H, Paliwal, Ria, MD, 2 packet at 07/08/24 0641   propofol  (DIPRIVAN ) 1000 MG/100ML infusion, 0-20 mcg/kg/min, Intravenous, Continuous, Voncile Isles, MD, Stopped at 07/07/24 1303  Labs and Diagnostic Imaging   CBC:  Recent Labs  Lab 07/05/24 0500 07/05/24 1030 07/06/24 0430 07/06/24 1146  WBC 34.8*  --  17.9*  --   NEUTROABS 31.2*  --  12.7*  --   HGB 14.5   < > 13.5 12.2*  HCT 41.4   < > 38.4* 36.0*  MCV 93.0  --  91.0  --   PLT 304  --  196  --    < > = values in this interval not displayed.    Basic Metabolic Panel:  Lab Results  Component Value Date   NA 139 07/08/2024   K 3.6 07/08/2024   CO2 27 07/08/2024   GLUCOSE 208 (H) 07/08/2024   BUN 15 07/08/2024   CREATININE 0.60 (L) 07/08/2024   CALCIUM 8.4 (L) 07/08/2024   GFRNONAA >60 07/08/2024   AED levels:  Lab Results  Component Value Date   PHENYTOIN  12.1 07/08/2024    CT Head without contrast(Personally reviewed): No acute process   Continuous video EEG from overnight pending.  .  Assessment   Dale Mason is a 58 y.o. 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  He complained of not feeling well and asked his wife to call 911 after the wasp stings and became unresponsive.  On EMS arrival he was once responsive and diaphoretic with no palpable pulse.  Airway established in the field had multiple rounds of epi during the CPR.  After transferred from [facility] it was noted that he had continuous stitching of the eyelid concerning for ongoing seizures.  He was increased on propofol  that he was at at [facility] and 75 mcg/kg/min to 100. Versed  10 mg  followed by 5 mg/h Versed  drip.  He was continued on Dilantin  and started on Keppra  after load. Rapid EEG was started which was changed to regular EEG which showed continuing epileptiform discharges and increasing frequency of burst  Eventually on Keppra , Vimpat  and phenytoin .  Kept on Versed  and propofol  drips for burst suppression.  He has been adequately suppressed for 24 hours.  Sedation weaned off-on no sedation since yesterday.  At this time, exam with minimal brainstem function and no elicitable higher cortical function.  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  Impression: Breakthrough seizure with status epilepticus likely provoked by wasp bite-eventually leading to cardiac arrest and anoxic brain injury  Recommendations  Continue Keppra  1500 twice daily, Vimpat  200 twice daily, phenytoin  150 twice  daily. Off of sedation Discontinue LTM Obtain MRI of the brain Will have further discussions based on imaging studies Updated the wife that prognosis appears grim in terms of neurologically meaningful recovery  Plan discussed with Dr. Zaida in the unit. ______________________________________________________________________   Signed, Eligio Lav, MD [facility]   CRITICAL CARE ATTESTATION Performed by: Eligio Lav, MD Total critical care time: 30 minutes Critical care time was exclusive of separately billable procedures and treating other patients and/or supervising APPs/Residents/Students Critical care was necessary to treat or prevent imminent or life-threatening deterioration. This patient is critically ill and at significant risk for neurological worsening and/or death and care requires constant monitoring. Critical care was time spent personally by me on the following activities: development of treatment plan with patient and/or surrogate as well as nursing, discussions with consultants, evaluation of patient's response to  treatment, examination of patient, obtaining history from patient or surrogate, ordering and performing treatments and interventions, ordering and review of laboratory studies, ordering and review of radiographic studies, pulse oximetry, re-evaluation of patient's condition, participation in multidisciplinary rounds and medical decision making of high complexity in the care of this patient.    Addendum MRI of the brain reviewed-suggestive of severe anoxic brain injury With this poor exam as noted this morning, long downtime as provided by history with cardiac arrest, and now with poor imaging and EEG findings of severe profound diffuse encephalopathy-I do not anticipate a neurologically meaningful recovery.  I would recommend comfort measures.  This was relayed to Dr. Donzetta over secure chat.  -- Eligio Lav, MD Neurologist [facility] Additional 10 min cc time

## 2024-07-08 NOTE — Progress Notes (Signed)
 [facility] ADULT ICU REPLACEMENT PROTOCOL   The patient does apply for the [facility] Adult ICU Electrolyte Replacment Protocol based on the criteria listed below:   1.Exclusion criteria: TCTS, ECMO, Dialysis, and Myasthenia Gravis patients 2. Is GFR >/= 30 ml/min? Yes.    Patient's GFR today is >60 3. Is SCr </= 2? Yes.   Patient's SCr is 0.60 mg/dL 4. Did SCr increase >/= 0.5 in 24 hours? No. 5.Pt's weight >40kg  Yes.   6. Abnormal electrolyte(s): Phos 1.9, K+ 3.6  7. Electrolytes replaced per protocol 8.  Call MD STAT for K+ </= 2.5, Phos </= 1, or Mag </= 1 Physician:  Haze Ole BIRCH [facility] 07/08/2024 6:04 AM

## 2024-07-08 NOTE — Progress Notes (Signed)
   07/08/24 1300  Spiritual Encounters  Type of Visit Initial  Care provided to: Pt and family  Conversation partners present during encounter Nurse  Referral source Nurse (RN/NT/LPN)  Reason for visit Urgent spiritual support  OnCall Visit Yes  Interventions  Spiritual Care Interventions Made Established relationship of care and support;Compassionate presence;Encouragement;Reflective listening;Normalization of emotions;Meaning making  Intervention Outcomes  Outcomes Awareness of support;Reduced anxiety   Chaplain responded to spiritual consult for MLT and received report from nurse.  Chaplain met and visited with wife who is having a difficult time with the patients most recent dx.  Actively listened to the concerns on her heart and gave space for her to express her feelings, reduce isolation, and lower anxiety.  Provided words of encouragement, compassion and empathy.   Chaplain spiritual support services remain available as the need arises.

## 2024-07-08 NOTE — Procedures (Signed)
 Arterial Catheter Insertion Procedure Note  Dale Mason  [id number]  June 27, 1966  Date:07/08/24  Time:11:01 PM    Provider Performing: Deward LELON Eastern    Procedure: Insertion of Arterial Line (63379) with US  guidance (23062)   Indication(s) Blood pressure monitoring and/or need for frequent ABGs  Consent Risks of the procedure as well as the alternatives and risks of each were explained to the patient and/or caregiver.  Consent for the procedure was obtained and is signed in the bedside chart  Anesthesia None   Time Out Verified patient identification, verified procedure, site/side was marked, verified correct patient position, special equipment/implants available, medications/allergies/relevant history reviewed, required imaging and test results available.   Sterile Technique Maximal sterile technique including full sterile barrier drape, hand hygiene, sterile gown, sterile gloves, mask, hair covering, sterile ultrasound probe cover (if used).   Procedure Description Area of catheter insertion was cleaned with chlorhexidine  and draped in sterile fashion. With real-time ultrasound guidance an arterial catheter was placed into the left femoral artery.  Appropriate arterial tracings confirmed on monitor.     Complications/Tolerance None; patient tolerated the procedure well.   EBL Minimal   Specimen(s) None   Deward Eastern, AGACNP-BC [facility]  See Amion for personal pager PCCM on call pager [phone] until 7pm. Please call Elink 7p-7a. [phone]  07/08/2024 11:01 PM

## 2024-07-08 NOTE — Progress Notes (Signed)
 Pt transported on vent from 2M14 to MRI and back without any complications. RN at bedside.

## 2024-07-08 NOTE — Progress Notes (Signed)
 NAME:  Dale Mason, MRN:  [id number], DOB:  [birth year], LOS: 4 ADMISSION DATE:  07/04/2024, CONSULTATION DATE:  07/08/24 REFERRING MD:  EDP, CHIEF COMPLAINT:  anaphylaxis   History of Present Illness:  58 yo male working outside today when he sustained multiple wasp stings. Pt has sustained wasp stings previously without issue. However after being stung for a second time on his head this evening he was having shortness of breath, feeling of weakness with difficulty walking and wobbling with near falls. EMS was called and upon arrival he was found unresponsive, diaphoretic with agonal breathing and bradycardia, no palpable pulse and ALCS was started, airway established in the field. Pt has multiple rounds of epi between home and hospital pea and asystole at pulse checks, ROSC ultimately achieved and pt was started in epi infusion. EDP states he is adding levophed  at this time as well.   Cvc and arterial line were placed. Labs revealed acidosis, transaminitis and aki. CCM was asked for admission in light of the above.   All history is obtained from chart review, ROS unobtainable 2/2 intubated status.   Upon arrival to [facility] ICU, patient noted to have myoclonic jerking/seizure activity. STAT Ativan  2mg  given ordered by Ema CCM MD Mohon CT-scan of head obtained- no intracranial abnormality noted. Patient's most recent ABG- PH 7.138, Pco2  Update: pt began having seizure like activity. Last sz noted per wife was 2007. He is diligent about his medications and has not missed a dose of dilantin . Eeg and neuro consult ordered. Dilantin  level sent.    Pertinent  Medical History  Sz d/o  Significant Hospital Events: Including procedures, antibiotic start and stop dates in addition to other pertinent events   Presented to [facility] with anaphylactic shock 8/3 Transferred to [facility] for admission to ICU 8/3 8/7 Off all sedation with no spontaneous activity. MRI brain with severe anoxic brain injury   Interim History  / Subjective:   Objective    Blood pressure 108/62, pulse 72, temperature 99 F (37.2 C), temperature source Other (Comment), resp. rate (!) 28, height 6' 4 (1.93 m), weight 90.6 kg, SpO2 100%.    Vent Mode: PSV;CPAP FiO2 (%):  [40 %] 40 % Set Rate:  [28 bmp] 28 bmp Vt Set:  [690 mL] 690 mL PEEP:  [5 cmH20] 5 cmH20 Pressure Support:  [2 cmH20-10 cmH20] 10 cmH20 Plateau Pressure:  [16 cmH20-21 cmH20] 16 cmH20   Intake/Output Summary (Last 24 hours) at 07/08/2024 1135 Last data filed at 07/08/2024 1000 Gross per 24 hour  Intake 1716.12 ml  Output 2265 ml  Net -548.88 ml   Filed Weights   07/04/24 2117 07/07/24 0424 07/08/24 0500  Weight: 90.7 kg 90.3 kg 90.6 kg    Examination: Physical Exam   Resolved problem list   Assessment and Plan   89 M with a past medical history of seizures post traumatic head injury, presenting after being stung by multiple wasps with presentation concerning for cardiac arrest. He was noted to have convulsions and stiff extrememties followed by unresponsiveness with no palpable pulse. ICU evaluation with concern for anaphylactic shock vs septic shock.   Neuro #Seizures  #Hx of head trauma  #Toxic metabolic encephalopathy  Off sedation with minimal activity. Concerning for anoxic brain injury - EEG off and plan for MRI today for further assessmnet. MRI with severe anoxic brain injury. Wife updated.  - Appreciate neuro recs  - Following med levels   Cardiovascular  Anaphylactic shock vs septic shock  Type  II NSTEMI   Spiked a temp of 38 today and continues on pressors. Concern for sepsis at this point, started CTX for possible aspiration even during episode of unresponsiveness. Plan for a 5 day course  - blood cultures so far no growth  - Troponin elevation likely due to demand but will recheck now. TTE reassuring  - Stop steroids - D/C Pressors  - Stop H1 blocker  - Start labetalol  10mg  Q8h PRN for systolic > 180   Pulmonary  #Acute  hypoxic respiratory failure #Bullous emphysema  Currently on low vent settings  Continue vent ACVC with TV 6-8 -Duo nebs Q4h  GI  #transaminits (likely due to shock) - Continue tube feeds  - Continue PPI   Renal  #AKI   #metabolic acidosis  #hyperkalemia  Likely prerenal/ Renal due to shock and possible ATN  - monitor Cr, avoid nephrotoxins    Endo  #Hyperglycemia  SSI, switched to drip and back to SSI today due to uncontrolled hyperglycemia   Onc  Continue lovenox    ID  Continue CTX for 5 days      Best Practice (right click and Reselect all SmartList Selections daily)   Diet/type: Tube feeds  DVT prophylaxis Lovenox  Pressure ulcer(s): N/A GI prophylaxis:  PPI  Lines: D/C centrla line today  Foley:  Yes, and it is still needed Code Status:  full code Last date of multidisciplinary goals of care discussion [--]  Labs   CBC: Recent Labs  Lab 07/04/24 2045 07/05/24 0007 07/05/24 0341 07/05/24 0500 07/05/24 1030 07/06/24 0430 07/06/24 1146  WBC 9.4  --   --  34.8*  --  17.9*  --   NEUTROABS 2.5  --   --  31.2*  --  12.7*  --   HGB 16.0   < > 13.3 14.5 13.9 13.5 12.2*  HCT 48.8   < > 39.0 41.4 41.0 38.4* 36.0*  MCV 97.6  --   --  93.0  --  91.0  --   PLT 305  --   --  304  --  196  --    < > = values in this interval not displayed.    Basic Metabolic Panel: Recent Labs  Lab 07/05/24 0240 07/05/24 0341 07/05/24 1151 07/06/24 0430 07/06/24 0722 07/06/24 1146 07/06/24 1603 07/07/24 0340 07/08/24 0408  NA  --    < > 136  --  131* 133* 133* 134* 139  K  --    < > 3.7  --  4.0 5.5* 4.5 4.8 3.6  CL  --    < > 103  --  105  --  100 101 105  CO2  --    < > 21*  --  20*  --  23 22 27   GLUCOSE  --    < > 209*  --  107*  --  139* 142* 208*  BUN  --    < > 22*  --  19  --  17 26* 15  CREATININE  --    < > 1.71*  --  0.91  --  0.95 1.72* 0.60*  CALCIUM  --    < > 7.2*  --  7.1*  --  7.4* 7.8* 8.4*  MG 1.7  --   --  2.2  --   --   --  2.1 2.2  PHOS  3.0  --   --  4.3  --   --   --  6.0* 1.9*   < > =  values in this interval not displayed.   GFR: Estimated Creatinine Clearance: 125.1 mL/min (A) (by C-G formula based on SCr of 0.6 mg/dL (L)). Recent Labs  Lab 07/04/24 2045 07/04/24 2352 07/05/24 0240 07/05/24 0500 07/06/24 0430  WBC 9.4  --   --  34.8* 17.9*  LATICACIDVEN 8.4* 2.3* 1.8  --   --     Liver Function Tests: Recent Labs  Lab 07/04/24 2045 07/05/24 0500 07/08/24 0408  AST 90* 118* 44*  ALT 80* 103* 41  ALKPHOS 124 91 74  BILITOT 0.7 0.4 0.4  PROT 5.6* 4.6* 5.3*  ALBUMIN  3.3* 2.9* 2.4*   No results for input(s): LIPASE, AMYLASE in the last 168 hours. No results for input(s): AMMONIA in the last 168 hours.  ABG    Component Value Date/Time   PHART 7.343 (L) 07/06/2024 1146   PCO2ART 42.5 07/06/2024 1146   PO2ART 55 (L) 07/06/2024 1146   HCO3 23.2 07/06/2024 1146   TCO2 24 07/06/2024 1146   ACIDBASEDEF 3.0 (H) 07/06/2024 1146   O2SAT 87 07/06/2024 1146     Coagulation Profile: No results for input(s): INR, PROTIME in the last 168 hours.  Cardiac Enzymes: Recent Labs  Lab 07/05/24 0001 07/05/24 0500  CKTOTAL 734* 804*    HbA1C: No results found for: HGBA1C  CBG: Recent Labs  Lab 07/07/24 1942 07/07/24 2340 07/08/24 0357 07/08/24 0805 07/08/24 1102  GLUCAP 190* 195* 217* 168* 136*    Review of Systems:   Unobtainable 2/2 intubated and sedated status  Past Medical History:  He,  has a past medical history of Seizures (HCC).   Surgical History:  No past surgical history on file.   Social History:   reports that he has been smoking. He has never used smokeless tobacco. He reports current alcohol use.  Drug: Marijuana.   Family History:  His family history is not on file.   Allergies No Known Allergies   Home Medications  Prior to Admission medications   Medication Sig Start Date End Date Taking? Authorizing Provider  amoxicillin  (AMOXIL ) 500 MG capsule Take 1  capsule (500 mg total) by mouth 3 (three) times daily. 11/25/21   Haze Lonni PARAS, MD  amoxicillin -clavulanate (AUGMENTIN ) 875-125 MG tablet Take 1 tablet by mouth every 12 (twelve) hours. 08/13/23   Stuart Vernell Norris, PA-C  HYDROcodone -acetaminophen  (NORCO/VICODIN) 5-325 MG tablet Take 1 tablet by mouth every 4 (four) hours as needed. 02/25/16   Idol, Julie, PA-C  naproxen  (NAPROSYN ) 500 MG tablet Take 1 tablet (500 mg total) by mouth 2 (two) times daily as needed. 09/26/19   Rancour, Garnette, MD  phenytoin  (DILANTIN ) 100 MG ER capsule Take 200-300 mg by mouth 2 (two) times daily. Take 2 capsules in the morning and 3 capsules at night.    [provider]  phenytoin  (DILANTIN ) 100 MG ER capsule Take by mouth.    [provider]  predniSONE  (DELTASONE ) 20 MG tablet Take 2 tablets (40 mg total) by mouth daily with breakfast. 11/25/21   Haze Lonni PARAS, MD  predniSONE  (DELTASONE ) 20 MG tablet Take 2 tablets (40 mg total) by mouth daily with breakfast. 08/13/23   Stuart Vernell Norris, PA-C     Critical care time:    The patient is critically ill due to cardiovascular collapse currently needing active titration of vasopressors, acute on chronic hypoxic respiratory failure with active vent management .  Critical care was necessary to treat or prevent imminent or life-threatening deterioration. Critical care time was spent  by me on the following activities: development of a treatment plan with the patient and/or surrogate as well as nursing, discussions with consultants, evaluation of the patient's response to treatment, examination of the patient, obtaining a history from the patient or surrogate, ordering and performing treatments and interventions, ordering and review of laboratory studies, ordering and review of radiographic studies, review of telemetry data including pulse oximetry, re-evaluation of patient's condition and participation in multidisciplinary  rounds.   I personally spent 32 minutes providing critical care not including any separately billable procedures.   Zola LOISE Herter, MD  Pulmonary Critical Care 07/08/2024 11:35 AM

## 2024-07-09 ENCOUNTER — Inpatient Hospital Stay (HOSPITAL_COMMUNITY)

## 2024-07-09 ENCOUNTER — Encounter (HOSPITAL_COMMUNITY): Payer: Self-pay | Admitting: Critical Care Medicine

## 2024-07-09 ENCOUNTER — Encounter (HOSPITAL_COMMUNITY): Payer: Self-pay | Admitting: Certified Registered"

## 2024-07-09 ENCOUNTER — Other Ambulatory Visit: Payer: Self-pay

## 2024-07-09 DIAGNOSIS — J9601 Acute respiratory failure with hypoxia: Secondary | ICD-10-CM | POA: Diagnosis not present

## 2024-07-09 DIAGNOSIS — R569 Unspecified convulsions: Secondary | ICD-10-CM | POA: Diagnosis not present

## 2024-07-09 DIAGNOSIS — R579 Shock, unspecified: Secondary | ICD-10-CM | POA: Diagnosis not present

## 2024-07-09 DIAGNOSIS — I214 Non-ST elevation (NSTEMI) myocardial infarction: Secondary | ICD-10-CM | POA: Diagnosis not present

## 2024-07-09 DIAGNOSIS — Z529 Donor of unspecified organ or tissue: Secondary | ICD-10-CM

## 2024-07-09 LAB — POCT I-STAT 7, (LYTES, BLD GAS, ICA,H+H)
Acid-Base Excess: 7 mmol/L — ABNORMAL HIGH (ref 0.0–2.0)
Acid-Base Excess: 8 mmol/L — ABNORMAL HIGH (ref 0.0–2.0)
Acid-Base Excess: 7 mmol/L — ABNORMAL HIGH (ref 0.0–2.0)
Bicarbonate: 35.2 mmol/L — ABNORMAL HIGH (ref 20.0–28.0)
Bicarbonate: 34.6 mmol/L — ABNORMAL HIGH (ref 20.0–28.0)
Bicarbonate: 34.2 mmol/L — ABNORMAL HIGH (ref 20.0–28.0)
Calcium, Ion: 1.28 mmol/L (ref 1.15–1.40)
Calcium, Ion: 1.26 mmol/L (ref 1.15–1.40)
Calcium, Ion: 1.25 mmol/L (ref 1.15–1.40)
HCT: 30 % — ABNORMAL LOW (ref 39.0–52.0)
HCT: 29 % — ABNORMAL LOW (ref 39.0–52.0)
HCT: 30 % — ABNORMAL LOW (ref 39.0–52.0)
Hemoglobin: 10.2 g/dL — ABNORMAL LOW (ref 13.0–17.0)
Hemoglobin: 9.9 g/dL — ABNORMAL LOW (ref 13.0–17.0)
Hemoglobin: 10.2 g/dL — ABNORMAL LOW (ref 13.0–17.0)
O2 Saturation: 95 %
O2 Saturation: 99 %
O2 Saturation: 95 %
Patient temperature: 99
Patient temperature: 98.9
Potassium: 5 mmol/L (ref 3.5–5.1)
Potassium: 5 mmol/L (ref 3.5–5.1)
Potassium: 4.5 mmol/L (ref 3.5–5.1)
Sodium: 145 mmol/L (ref 135–145)
Sodium: 146 mmol/L — ABNORMAL HIGH (ref 135–145)
Sodium: 145 mmol/L (ref 135–145)
TCO2: 37 mmol/L — ABNORMAL HIGH (ref 22–32)
TCO2: 36 mmol/L — ABNORMAL HIGH (ref 22–32)
TCO2: 36 mmol/L — ABNORMAL HIGH (ref 22–32)
pCO2 arterial: 74.2 mmHg (ref 32–48)
pCO2 arterial: 55.5 mmHg — ABNORMAL HIGH (ref 32–48)
pCO2 arterial: 61.6 mmHg — ABNORMAL HIGH (ref 32–48)
pH, Arterial: 7.285 — ABNORMAL LOW (ref 7.35–7.45)
pH, Arterial: 7.403 (ref 7.35–7.45)
pH, Arterial: 7.353 (ref 7.35–7.45)
pO2, Arterial: 91 mmHg (ref 83–108)
pO2, Arterial: 162 mmHg — ABNORMAL HIGH (ref 83–108)
pO2, Arterial: 81 mmHg — ABNORMAL LOW (ref 83–108)

## 2024-07-09 LAB — PROTIME-INR
INR: 1.1 (ref 0.8–1.2)
INR: 1.1 (ref 0.8–1.2)
INR: 1.2 (ref 0.8–1.2)
Prothrombin Time: 15 s (ref 11.4–15.2)
Prothrombin Time: 15.2 s (ref 11.4–15.2)
Prothrombin Time: 15.5 s — ABNORMAL HIGH (ref 11.4–15.2)

## 2024-07-09 LAB — LACTIC ACID, PLASMA
Lactic Acid, Venous: 0.6 mmol/L (ref 0.5–1.9)
Lactic Acid, Venous: 0.8 mmol/L (ref 0.5–1.9)
Lactic Acid, Venous: 0.9 mmol/L (ref 0.5–1.9)

## 2024-07-09 LAB — COMPREHENSIVE METABOLIC PANEL WITH GFR
ALT: 34 U/L (ref 0–44)
ALT: 39 U/L (ref 0–44)
ALT: 54 U/L — ABNORMAL HIGH (ref 0–44)
AST: 38 U/L (ref 15–41)
AST: 45 U/L — ABNORMAL HIGH (ref 15–41)
AST: 64 U/L — ABNORMAL HIGH (ref 15–41)
Albumin: 2.3 g/dL — ABNORMAL LOW (ref 3.5–5.0)
Albumin: 2.2 g/dL — ABNORMAL LOW (ref 3.5–5.0)
Albumin: 2.2 g/dL — ABNORMAL LOW (ref 3.5–5.0)
Alkaline Phosphatase: 71 U/L (ref 38–126)
Alkaline Phosphatase: 77 U/L (ref 38–126)
Alkaline Phosphatase: 96 U/L (ref 38–126)
Anion gap: 5 (ref 5–15)
Anion gap: 8 (ref 5–15)
Anion gap: 9 (ref 5–15)
BUN: 19 mg/dL (ref 6–20)
BUN: 21 mg/dL — ABNORMAL HIGH (ref 6–20)
BUN: 20 mg/dL (ref 6–20)
CO2: 33 mmol/L — ABNORMAL HIGH (ref 22–32)
CO2: 32 mmol/L (ref 22–32)
CO2: 32 mmol/L (ref 22–32)
Calcium: 8.5 mg/dL — ABNORMAL LOW (ref 8.9–10.3)
Calcium: 8.8 mg/dL — ABNORMAL LOW (ref 8.9–10.3)
Calcium: 8.9 mg/dL (ref 8.9–10.3)
Chloride: 107 mmol/L (ref 98–111)
Chloride: 107 mmol/L (ref 98–111)
Chloride: 106 mmol/L (ref 98–111)
Creatinine, Ser: 0.52 mg/dL — ABNORMAL LOW (ref 0.61–1.24)
Creatinine, Ser: 0.55 mg/dL — ABNORMAL LOW (ref 0.61–1.24)
Creatinine, Ser: 0.53 mg/dL — ABNORMAL LOW (ref 0.61–1.24)
GFR, Estimated: >60 mL/min (ref >60)
GFR, Estimated: >60 mL/min (ref >60)
GFR, Estimated: >60 mL/min (ref >60)
Glucose, Bld: 157 mg/dL — ABNORMAL HIGH (ref 70–99)
Glucose, Bld: 145 mg/dL — ABNORMAL HIGH (ref 70–99)
Glucose, Bld: 192 mg/dL — ABNORMAL HIGH (ref 70–99)
Potassium: 5.2 mmol/L — ABNORMAL HIGH (ref 3.5–5.1)
Potassium: 4.9 mmol/L (ref 3.5–5.1)
Potassium: 4.5 mmol/L (ref 3.5–5.1)
Sodium: 145 mmol/L (ref 135–145)
Sodium: 147 mmol/L — ABNORMAL HIGH (ref 135–145)
Sodium: 147 mmol/L — ABNORMAL HIGH (ref 135–145)
Total Bilirubin: <0.2 mg/dL (ref 0.0–1.2)
Total Bilirubin: 0.3 mg/dL (ref 0.0–1.2)
Total Bilirubin: 0.2 mg/dL (ref 0.0–1.2)
Total Protein: 5.3 g/dL — ABNORMAL LOW (ref 6.5–8.1)
Total Protein: 5.3 g/dL — ABNORMAL LOW (ref 6.5–8.1)
Total Protein: 5.4 g/dL — ABNORMAL LOW (ref 6.5–8.1)

## 2024-07-09 LAB — CK TOTAL AND CKMB (NOT AT ARMC)
CK, MB: 3.6 ng/mL (ref 0.5–5.0)
CK, MB: 1.6 ng/mL (ref 0.5–5.0)
Total CK: 173 U/L (ref 49–397)
Total CK: 114 U/L (ref 49–397)

## 2024-07-09 LAB — URINALYSIS, ROUTINE W REFLEX MICROSCOPIC
Bacteria, UA: NONE SEEN
Bilirubin Urine: NEGATIVE
Glucose, UA: NEGATIVE mg/dL
Ketones, ur: NEGATIVE mg/dL
Leukocytes,Ua: NEGATIVE
Nitrite: NEGATIVE
Protein, ur: 30 mg/dL — AB
Specific Gravity, Urine: 1.02 (ref 1.005–1.030)
pH: 5 (ref 5.0–8.0)

## 2024-07-09 LAB — GLUCOSE, CAPILLARY
Glucose-Capillary: 261 mg/dL — ABNORMAL HIGH (ref 70–99)
Glucose-Capillary: 155 mg/dL — ABNORMAL HIGH (ref 70–99)
Glucose-Capillary: 128 mg/dL — ABNORMAL HIGH (ref 70–99)
Glucose-Capillary: 128 mg/dL — ABNORMAL HIGH (ref 70–99)
Glucose-Capillary: 148 mg/dL — ABNORMAL HIGH (ref 70–99)
Glucose-Capillary: 191 mg/dL — ABNORMAL HIGH (ref 70–99)

## 2024-07-09 LAB — TROPONIN I (HIGH SENSITIVITY)
Troponin I (High Sensitivity): 16 ng/L (ref <18)
Troponin I (High Sensitivity): 14 ng/L (ref <18)

## 2024-07-09 LAB — ECHOCARDIOGRAM COMPLETE
AR max vel: 4.55 cm2
AV Area VTI: 4.38 cm2
AV Area mean vel: 4.62 cm2
AV Mean grad: 5 mmHg
AV Peak grad: 8.8 mmHg
Ao pk vel: 1.48 m/s
Area-P 1/2: 3.39 cm2
Height: 76 in
S' Lateral: 3.6 cm
Weight: 3248.7 [oz_av]

## 2024-07-09 LAB — CBC
HCT: 32 % — ABNORMAL LOW (ref 39.0–52.0)
HCT: 34.7 % — ABNORMAL LOW (ref 39.0–52.0)
Hemoglobin: 10.5 g/dL — ABNORMAL LOW (ref 13.0–17.0)
Hemoglobin: 11 g/dL — ABNORMAL LOW (ref 13.0–17.0)
MCH: 32.1 pg (ref 26.0–34.0)
MCH: 31.5 pg (ref 26.0–34.0)
MCHC: 32.8 g/dL (ref 30.0–36.0)
MCHC: 31.7 g/dL (ref 30.0–36.0)
MCV: 97.9 fL (ref 80.0–100.0)
MCV: 99.4 fL (ref 80.0–100.0)
Platelets: 130 K/uL — ABNORMAL LOW (ref 150–400)
Platelets: 123 K/uL — ABNORMAL LOW (ref 150–400)
RBC: 3.27 MIL/uL — ABNORMAL LOW (ref 4.22–5.81)
RBC: 3.49 MIL/uL — ABNORMAL LOW (ref 4.22–5.81)
RDW: 13.9 % (ref 11.5–15.5)
RDW: 13.9 % (ref 11.5–15.5)
WBC: 10.5 K/uL (ref 4.0–10.5)
WBC: 10.8 K/uL — ABNORMAL HIGH (ref 4.0–10.5)
nRBC: 0 % (ref 0.0–0.2)
nRBC: 0 % (ref 0.0–0.2)

## 2024-07-09 LAB — FIBRINOGEN
Fibrinogen: 767 mg/dL — ABNORMAL HIGH (ref 210–475)
Fibrinogen: >800 mg/dL — ABNORMAL HIGH (ref 210–475)
Fibrinogen: >800 mg/dL — ABNORMAL HIGH (ref 210–475)

## 2024-07-09 LAB — MAGNESIUM
Magnesium: 2.2 mg/dL (ref 1.7–2.4)
Magnesium: 2 mg/dL (ref 1.7–2.4)
Magnesium: 2 mg/dL (ref 1.7–2.4)

## 2024-07-09 LAB — CBC WITH DIFFERENTIAL/PLATELET
Abs Granulocyte: 8.2 K/uL — ABNORMAL HIGH (ref 1.5–6.5)
Abs Immature Granulocytes: 0.09 K/uL — ABNORMAL HIGH (ref 0.00–0.07)
Basophils Absolute: 0 K/uL (ref 0.0–0.1)
Basophils Relative: 0 %
Eosinophils Absolute: 0 K/uL (ref 0.0–0.5)
Eosinophils Relative: 0 %
HCT: 33.2 % — ABNORMAL LOW (ref 39.0–52.0)
Hemoglobin: 10.8 g/dL — ABNORMAL LOW (ref 13.0–17.0)
Immature Granulocytes: 1 %
Lymphocytes Relative: 9 %
Lymphs Abs: 1 K/uL (ref 0.7–4.0)
MCH: 32 pg (ref 26.0–34.0)
MCHC: 32.5 g/dL (ref 30.0–36.0)
MCV: 98.5 fL (ref 80.0–100.0)
Monocytes Absolute: 1.1 K/uL — ABNORMAL HIGH (ref 0.1–1.0)
Monocytes Relative: 10 %
Neutro Abs: 8.2 K/uL — ABNORMAL HIGH (ref 1.7–7.7)
Neutrophils Relative %: 80 %
Platelets: 126 K/uL — ABNORMAL LOW (ref 150–400)
RBC: 3.37 MIL/uL — ABNORMAL LOW (ref 4.22–5.81)
RDW: 13.8 % (ref 11.5–15.5)
WBC: 10.3 K/uL (ref 4.0–10.5)
nRBC: 0 % (ref 0.0–0.2)

## 2024-07-09 LAB — EXPECTORATED SPUTUM ASSESSMENT W GRAM STAIN, RFLX TO RESP C

## 2024-07-09 LAB — APTT
aPTT: 30 s (ref 24–36)
aPTT: 26 s (ref 24–36)
aPTT: 33 s (ref 24–36)

## 2024-07-09 LAB — PHOSPHORUS
Phosphorus: 3.4 mg/dL (ref 2.5–4.6)
Phosphorus: 2 mg/dL — ABNORMAL LOW (ref 2.5–4.6)
Phosphorus: 3.2 mg/dL (ref 2.5–4.6)

## 2024-07-09 LAB — ABO/RH

## 2024-07-09 LAB — BILIRUBIN, DIRECT
Bilirubin, Direct: <0.1 mg/dL (ref 0.0–0.2)
Bilirubin, Direct: <0.1 mg/dL (ref 0.0–0.2)
Bilirubin, Direct: <0.1 mg/dL (ref 0.0–0.2)

## 2024-07-09 LAB — SARS CORONAVIRUS 2 BY RT PCR: SARS Coronavirus 2 by RT PCR: NEGATIVE

## 2024-07-09 LAB — TRIGLYCERIDES: Triglycerides: 34 mg/dL (ref <150)

## 2024-07-09 MED ORDER — CLINDAMYCIN PHOSPHATE 600 MG/50ML IV SOLN
600.0000 mg | Freq: Three times a day (TID) | INTRAVENOUS | Status: DC
  Administered 2024-07-09 – 2024-07-10 (×2): 600 mg via INTRAVENOUS
  Filled 2024-07-09 (×3): qty 50

## 2024-07-09 MED ORDER — SODIUM PHOSPHATES 45 MMOLE/15ML IV SOLN
30.0000 mmol | Freq: Once | INTRAVENOUS | Status: AC
  Administered 2024-07-09: 30 mmol via INTRAVENOUS
  Filled 2024-07-09: qty 10

## 2024-07-09 MED ORDER — ALBUMIN HUMAN 25 % IV SOLN
25.0000 g | Freq: Once | INTRAVENOUS | Status: AC
  Administered 2024-07-09: 12.5 g via INTRAVENOUS
  Filled 2024-07-09: qty 100

## 2024-07-09 MED ORDER — IOHEXOL 350 MG/ML SOLN
75.0000 mL | Freq: Once | INTRAVENOUS | Status: AC | PRN
  Administered 2024-07-09: 75 mL via INTRAVENOUS

## 2024-07-09 MED ORDER — ENOXAPARIN SODIUM 100 MG/ML IJ SOSY
90.0000 mg | PREFILLED_SYRINGE | Freq: Two times a day (BID) | INTRAMUSCULAR | Status: DC
  Administered 2024-07-10 (×2): 90 mg via SUBCUTANEOUS
  Filled 2024-07-09 (×3): qty 0.9

## 2024-07-09 MED ORDER — EPINEPHRINE 1 MG/10ML IJ SOSY
0.5000 mg | PREFILLED_SYRINGE | Freq: Once | INTRAMUSCULAR | Status: AC
  Administered 2024-07-09: 0.5 mg via INTRAVENOUS

## 2024-07-09 MED ORDER — PHENYLEPHRINE 80 MCG/ML (10ML) SYRINGE FOR IV PUSH (FOR BLOOD PRESSURE SUPPORT)
PREFILLED_SYRINGE | INTRAVENOUS | Status: AC
  Filled 2024-07-09: qty 10

## 2024-07-09 NOTE — Progress Notes (Signed)
 NAME:  Dale Mason, MRN:  [id number], DOB:  [birth year], LOS: 5 ADMISSION DATE:  07/04/2024, CONSULTATION DATE:  07/08/24 REFERRING MD:  EDP, CHIEF COMPLAINT:  anaphylaxis   History of Present Illness:  58 yo male working outside today when he sustained multiple wasp stings. Pt has sustained wasp stings previously without issue. However after being stung for a second time on his head this evening he was having shortness of breath, feeling of weakness with difficulty walking and wobbling with near falls. EMS was called and upon arrival he was found unresponsive, diaphoretic with agonal breathing and bradycardia, no palpable pulse and ALCS was started, airway established in the field. Pt has multiple rounds of epi between home and hospital pea and asystole at pulse checks, ROSC ultimately achieved and pt was started in epi infusion. EDP states he is adding levophed  at this time as well.   Cvc and arterial line were placed. Labs revealed acidosis, transaminitis and aki. CCM was asked for admission in light of the above.   All history is obtained from chart review, ROS unobtainable 2/2 intubated status.   Upon arrival to [facility] ICU, patient noted to have myoclonic jerking/seizure activity. STAT Ativan  2mg  given ordered by Ema CCM MD [place] CT-scan of head obtained- no intracranial abnormality noted. Patient's most recent ABG- PH 7.138, Pco2  Update: pt began having seizure like activity. Last sz noted per wife was 2007. He is diligent about his medications and has not missed a dose of dilantin . Eeg and neuro consult ordered. Dilantin  level sent.    Pertinent  Medical History  Sz d/o  Significant Hospital Events: Including procedures, antibiotic start and stop dates in addition to other pertinent events   Presented to [facility] with anaphylactic shock 8/3 Transferred to [facility] for admission to ICU 8/3 8/7 Off all sedation with no spontaneous activity. 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 rate (!) 28, height 6' 4 (1.93 m), weight 92.1 kg, SpO2 96%.    Vent Mode: PRVC FiO2 (%):  [40 %] 40 % Set Rate:  [28 bmp] 28 bmp Vt Set:  [520 mL-690 mL] 690 mL PEEP:  [5 cmH20] 5 cmH20 Plateau Pressure:  [12 cmH20-16 cmH20] 14 cmH20   Intake/Output Summary (Last 24 hours) at 07/09/2024 1416 Last data filed at 07/09/2024 1100 Gross per 24 hour  Intake 1800.14 ml  Output 1650 ml  Net 150.14 ml   Filed Weights   07/07/24 0424 07/08/24 0500 07/09/24 0500  Weight: 90.3 kg 90.6 kg 92.1 kg    Examination: Physical Exam Constitutional:      Comments: Unresponsive   HENT:     Nose: Nose normal.     Mouth/Throat:     Mouth: Mucous membranes are moist.  Cardiovascular:     Rate and Rhythm: Normal rate.     Pulses: Normal pulses.  Pulmonary:     Effort: Pulmonary effort is normal.  Abdominal:     General: Abdomen is flat.  Skin:    General: Skin is warm.     Capillary Refill: Capillary refill takes less than 2 seconds.  Neurological:     Comments: unresponsive      Resolved problem list   Assessment and Plan   61 M with a past medical history of seizures post traumatic head injury, presenting after being stung by multiple wasps  with presentation concerning for cardiac arrest. He was noted to have convulsions and stiff extrememties followed by unresponsiveness with no palpable pulse. ICU evaluation with concern for anaphylactic shock vs septic shock.   Neuro #Seizures  #Hx of head trauma  #Toxic metabolic encephalopathy  Off sedation with minimal activity. Concerning for anoxic brain injury - MRI  consistent with severe anoxic brain injury. Family considering donation. Case sent to [facility] for further assessment given reported seizure around time of cardiac arrest    Cardiovascular  Anaphylactic shock vs septic shock  Type II NSTEMI   CTX for  possible aspiration even during episode of unresponsiveness. Plan for a 5 day course  - blood cultures so far no growth  - Troponin elevation likely due to demand but will recheck now. TTE reassuring  - Stop steroids - D/C Pressors  - Stop H1 blocker  - Start labetalol  10mg  Q8h PRN for systolic > 180   Pulmonary  #Acute hypoxic respiratory failure #Bullous emphysema  Currently on low vent settings  Continue vent ACVC with TV 6-8 -Duo nebs Q4h  GI  #transaminits (likely due to shock) - Continue tube feeds  - Continue PPI   Renal  #AKI   #metabolic acidosis  #hyperkalemia  Likely prerenal/ Renal due to shock and possible ATN  - monitor Cr, avoid nephrotoxins    Endo  #Hyperglycemia  SSI, switched to drip and back to SSI today due to uncontrolled hyperglycemia   Onc  Continue lovenox    ID  Continue CTX for 5 days      Best Practice (right click and Reselect all SmartList Selections daily)   Diet/type: Tube feeds  DVT prophylaxis Lovenox  Pressure ulcer(s): N/A GI prophylaxis:  PPI  Lines: D/C centrla line today  Foley:  Yes, and it is still needed Code Status:  full code Last date of multidisciplinary goals of care discussion [--]  Labs   CBC: Recent Labs  Lab 07/04/24 2045 07/05/24 0007 07/05/24 0500 07/05/24 1030 07/06/24 0430 07/06/24 1146 07/09/24 0331 07/09/24 0510 07/09/24 1245  WBC 9.4  --  34.8*  --  17.9*  --  10.3  --  10.5  NEUTROABS 2.5  --  31.2*  --  12.7*  --  8.2*  --   --   HGB 16.0   < > 14.5   < > 13.5 12.2* 10.8* 10.2* 10.5*  HCT 48.8   < > 41.4   < > 38.4* 36.0* 33.2* 30.0* 32.0*  MCV 97.6  --  93.0  --  91.0  --  98.5  --  97.9  PLT 305  --  304  --  196  --  126*  --  130*   < > = values in this interval not displayed.    Basic Metabolic Panel: Recent Labs  Lab 07/06/24 0430 07/06/24 0722 07/06/24 1603 07/07/24 0340 07/08/24 0408 07/09/24 0331 07/09/24 0510 07/09/24 1245  NA  --    < > 133* 134* 139 145 145  147*  K  --    < > 4.5 4.8 3.6 5.2* 5.0 4.9  CL  --    < > 100 101 105 107  --  107  CO2  --    < > 23 22 27  33*  --  32  GLUCOSE  --    < > 139* 142* 208* 157*  --  145*  BUN  --    < > 17 26* 15 19  --  21*  CREATININE  --    < > 0.95 1.72* 0.60* 0.52*  --  0.55*  CALCIUM  --    < > 7.4* 7.8* 8.4* 8.5*  --  8.8*  MG 2.2  --   --  2.1 2.2 2.2  --  2.0  PHOS 4.3  --   --  6.0* 1.9* 3.4  --  2.0*   < > = values in this interval not displayed.   GFR: Estimated Creatinine Clearance: 125.1 mL/min (A) (by C-G formula based on SCr of 0.55 mg/dL (L)). Recent Labs  Lab 07/04/24 2352 07/05/24 0240 07/05/24 0500 07/06/24 0430 07/09/24 0331 07/09/24 1245  WBC  --   --  34.8* 17.9* 10.3 10.5  LATICACIDVEN 2.3* 1.8  --   --  0.6 0.8    Liver Function Tests: Recent Labs  Lab 07/04/24 2045 07/05/24 0500 07/08/24 0408 07/09/24 0331 07/09/24 1245  AST 90* 118* 44* 38 45*  ALT 80* 103* 41 34 39  ALKPHOS 124 91 74 71 77  BILITOT 0.7 0.4 0.4 <0.2 0.3  PROT 5.6* 4.6* 5.3* 5.3* 5.3*  ALBUMIN  3.3* 2.9* 2.4* 2.3* 2.2*   No results for input(s): LIPASE, AMYLASE in the last 168 hours. No results for input(s): AMMONIA in the last 168 hours.  ABG    Component Value Date/Time   PHART 7.285 (L) 07/09/2024 0510   PCO2ART 74.2 (HH) 07/09/2024 0510   PO2ART 91 07/09/2024 0510   HCO3 35.2 (H) 07/09/2024 0510   TCO2 37 (H) 07/09/2024 0510   ACIDBASEDEF 3.0 (H) 07/06/2024 1146   O2SAT 95 07/09/2024 0510     Coagulation Profile: Recent Labs  Lab 07/09/24 0331 07/09/24 1245  INR 1.1 1.1    Cardiac Enzymes: Recent Labs  Lab 07/05/24 0001 07/05/24 0500 07/09/24 0331 07/09/24 1245  CKTOTAL 734* 804* 173 114  CKMB  --   --  3.6 1.6    HbA1C: No results found for: HGBA1C  CBG: Recent Labs  Lab 07/08/24 1949 07/08/24 2338 07/09/24 0338 07/09/24 0757 07/09/24 1149  GLUCAP 132* 143* 155* 128* 128*    Review of Systems:   Unobtainable 2/2 intubated and sedated  status  Past Medical History:  He,  has a past medical history of Seizures (HCC).   Surgical History:  History reviewed. No pertinent surgical history.   Social History:   reports that he has been smoking. He has never used smokeless tobacco. He reports current alcohol use.  Drug: Marijuana.   Family History:  His family history is not on file.   Allergies No Known Allergies   Home Medications  Prior to Admission medications   Medication Sig Start Date End Date Taking? Authorizing Provider  amoxicillin  (AMOXIL ) 500 MG capsule Take 1 capsule (500 mg total) by mouth 3 (three) times daily. 11/25/21   Haze Lonni PARAS, MD  amoxicillin -clavulanate (AUGMENTIN ) 875-125 MG tablet Take 1 tablet by mouth every 12 (twelve) hours. 08/13/23   Stuart Vernell Norris, PA-C  HYDROcodone -acetaminophen  (NORCO/VICODIN) 5-325 MG tablet Take 1 tablet by mouth every 4 (four) hours as needed. 02/25/16   Idol, Julie, PA-C  naproxen  (NAPROSYN ) 500 MG tablet Take 1 tablet (500 mg total) by mouth 2 (two) times daily as needed. 09/26/19   Rancour, Garnette, MD  phenytoin  (DILANTIN ) 100 MG ER capsule Take 200-300 mg by mouth 2 (two) times daily. Take 2 capsules in the morning and 3 capsules at night.    [provider]  phenytoin  (DILANTIN ) 100 MG ER capsule Take  by mouth.    [provider]  predniSONE  (DELTASONE ) 20 MG tablet Take 2 tablets (40 mg total) by mouth daily with breakfast. 11/25/21   Haze Lonni PARAS, MD  predniSONE  (DELTASONE ) 20 MG tablet Take 2 tablets (40 mg total) by mouth daily with breakfast. 08/13/23   Stuart Vernell Norris, PA-C      I spent 31 minutes caring for this patient today, including preparing to see the patient, obtaining a medical history , reviewing a separately obtained history, performing a medically appropriate examination and/or evaluation, counseling and educating the patient/family/caregiver, ordering medications, tests, or procedures, referring  and communicating with other health care professionals (not separately reported), documenting clinical information in the electronic health record, independently interpreting results (not separately reported/billed) and communicating results to the patient/family/caregiver, and care coordination (not separately reported/billed)    Zola LOISE Herter, MD [facility] 07/09/2024 2:16 PM

## 2024-07-09 NOTE — Progress Notes (Signed)
 Elink MD notified of ABG. Increased VT from 6cc to 8cc per [facility] MD.

## 2024-07-09 NOTE — Plan of Care (Signed)
  Problem: Clinical Measurements: Goal: Ability to maintain clinical measurements within normal limits will improve Outcome: Progressing   Problem: Nutrition: Goal: Adequate nutrition will be maintained Outcome: Progressing   Problem: Safety: Goal: Ability to remain free from injury will improve Outcome: Progressing   Problem: Fluid Volume: Goal: Ability to maintain a balanced intake and output will improve Outcome: Progressing   Problem: Metabolic: Goal: Ability to maintain appropriate glucose levels will improve Outcome: Progressing   Problem: Education: Goal: Ability to describe self-care measures that may prevent or decrease complications (Diabetes Survival Skills Education) will improve Outcome: Not Progressing   Problem: Coping: Goal: Ability to adjust to condition or change in health will improve Outcome: Not Progressing   Problem: Health Behavior/Discharge Planning: Goal: Ability to identify and utilize available resources and services will improve Outcome: Not Progressing Goal: Ability to manage health-related needs will improve Outcome: Not Progressing

## 2024-07-09 NOTE — Progress Notes (Signed)
 Pt transported from 2M14 to CT and back without complications.

## 2024-07-09 NOTE — Procedures (Signed)
 Insertion of Chest Tube Procedure Note  Dale Mason  [id number]  [birth year]  Date:07/09/24  Time:10:42 PM    Provider Performing: Deward LELON Eastern   Procedure: Chest Tube Insertion [phone])  Indication(s) Pneumothorax  Consent Unable to obtain consent due to emergent nature of procedure.  Anesthesia Topical only with 1% lidocaine     Time Out Verified patient identification, verified procedure, site/side was marked, verified correct patient position, special equipment/implants available, medications/allergies/relevant history reviewed, required imaging and test results available.   Sterile Technique Maximal sterile technique was not able to be achieved due to emergent nature of procedure.   Procedure Description Ultrasound used to identify appropriate pleural anatomy for placement and overlying skin marked. Area of placement cleaned and draped in sterile fashion.  A  pigtail pleural catheter was placed into the right anterior pleural space using Seldinger technique. Appropriate return of air was obtained.  The tube was connected to atrium and placed on -20 cm H2O wall suction.   Complications/Tolerance None; patient tolerated the procedure well. Chest X-ray is ordered to verify placement.   EBL Minimal  Specimen(s) none   Deward Eastern, AGACNP-BC [facility]  See Amion for personal pager PCCM on call pager [phone] until 7pm. Please call Elink 7p-7a. [phone]  07/09/2024 10:43 PM

## 2024-07-09 NOTE — Plan of Care (Signed)
  Problem: Skin Integrity: Goal: Risk for impaired skin integrity will decrease Outcome: Progressing   Problem: Safety: Goal: Ability to remain free from injury will improve Outcome: Progressing   Problem: Coping: Goal: Ability to adjust to condition or change in health will improve Outcome: Progressing

## 2024-07-09 NOTE — Plan of Care (Signed)
 MRI with severe anoxic brain injury Clinical exam also consistent with that Does still have respiratory drive Family looking for organ donation No services on board No further recommendations from the inpatient neurology service. Please call with questions as needed Discussed with Dr. Zaida

## 2024-07-09 NOTE — TOC CM/SW Note (Signed)
 Transition of Care [facility]) - Inpatient Brief Assessment   Patient Details  Name: Dale Mason MRN: [id number] Date of Birth: February 26, 1966  Transition of Care [facility]) CM/SW Contact:    Lauraine FORBES Saa, LCSWA Phone Number: 07/09/2024, 11:26 AM   Clinical Narrative:  11:26 AM Per progressions, patient is scheduled for organ donation tomorrow evening.  Transition of Care Asessment: Insurance and Status: Insurance coverage has been reviewed Patient has primary care physician: Yes Home environment has been reviewed: Private Residence Prior level of function:: N/A Prior/Current Home Services: No current home services Social Drivers of Health Review:  (Patient unable to answer) Readmission risk has been reviewed: Yes Transition of care needs: no transition of care needs at this time

## 2024-07-09 NOTE — Progress Notes (Signed)
 Pt transported on vent from 2M14 to CT and back without any complications. RN at bedside.

## 2024-07-09 NOTE — Progress Notes (Addendum)
 eLink Physician-Brief Progress Note Patient Name: Dale Mason DOB: 07/01/1966 MRN: [id number]   Date of Service  07/09/2024  HPI/Events of Note  Call from radiology about CT PE that was ordered.  The patient has pulmonary emboli, stable pneumothorax with early evidence of tension physiology.  Currently undergoing organ donation testing  eICU Interventions  Change Lovenox  to therapeutic dosing No hypotension for the time being, can hold on chest tube   0344 - borderline BP with wide pulse pressures, maintain off pressors unless sustained SBP < 90  0442 - Attempt to repeat Albumin  bolus, if ineffective, restart norepi  0619 -worsening respiratory acidemia, maximal ventilatory support, chest tube in place, deep sedation with appropriate vent synchrony, continue current care  Intervention Category Intermediate Interventions: Bleeding - evaluation and treatment with blood products  Janellie Tennison 07/09/2024, 9:31 PM

## 2024-07-10 ENCOUNTER — Encounter (HOSPITAL_COMMUNITY): Admission: EM | Disposition: E | Payer: Self-pay | Source: Home / Self Care

## 2024-07-10 ENCOUNTER — Inpatient Hospital Stay (HOSPITAL_COMMUNITY)

## 2024-07-10 DIAGNOSIS — J9601 Acute respiratory failure with hypoxia: Secondary | ICD-10-CM | POA: Diagnosis not present

## 2024-07-10 DIAGNOSIS — R579 Shock, unspecified: Secondary | ICD-10-CM | POA: Diagnosis not present

## 2024-07-10 DIAGNOSIS — J939 Pneumothorax, unspecified: Secondary | ICD-10-CM

## 2024-07-10 DIAGNOSIS — R569 Unspecified convulsions: Secondary | ICD-10-CM | POA: Diagnosis not present

## 2024-07-10 DIAGNOSIS — I214 Non-ST elevation (NSTEMI) myocardial infarction: Secondary | ICD-10-CM | POA: Diagnosis not present

## 2024-07-10 LAB — URINALYSIS, ROUTINE W REFLEX MICROSCOPIC
Bacteria, UA: NONE SEEN
Bilirubin Urine: NEGATIVE
Glucose, UA: NEGATIVE mg/dL
Ketones, ur: NEGATIVE mg/dL
Leukocytes,Ua: NEGATIVE
Nitrite: NEGATIVE
Protein, ur: 30 mg/dL — AB
Specific Gravity, Urine: >1.046 — ABNORMAL HIGH (ref 1.005–1.030)
pH: 5 (ref 5.0–8.0)

## 2024-07-10 LAB — CBC
HCT: 35.3 % — ABNORMAL LOW (ref 39.0–52.0)
HCT: 36.3 % — ABNORMAL LOW (ref 39.0–52.0)
HCT: 38.4 % — ABNORMAL LOW (ref 39.0–52.0)
Hemoglobin: 10.9 g/dL — ABNORMAL LOW (ref 13.0–17.0)
Hemoglobin: 11 g/dL — ABNORMAL LOW (ref 13.0–17.0)
Hemoglobin: 11.8 g/dL — ABNORMAL LOW (ref 13.0–17.0)
MCH: 31.4 pg (ref 26.0–34.0)
MCH: 31.7 pg (ref 26.0–34.0)
MCH: 31.9 pg (ref 26.0–34.0)
MCHC: 30.3 g/dL (ref 30.0–36.0)
MCHC: 30.7 g/dL (ref 30.0–36.0)
MCHC: 30.9 g/dL (ref 30.0–36.0)
MCV: 102.1 fL — ABNORMAL HIGH (ref 80.0–100.0)
MCV: 103.2 fL — ABNORMAL HIGH (ref 80.0–100.0)
MCV: 104.6 fL — ABNORMAL HIGH (ref 80.0–100.0)
Platelets: 112 K/uL — ABNORMAL LOW (ref 150–400)
Platelets: 126 K/uL — ABNORMAL LOW (ref 150–400)
Platelets: 135 K/uL — ABNORMAL LOW (ref 150–400)
RBC: 3.42 MIL/uL — ABNORMAL LOW (ref 4.22–5.81)
RBC: 3.47 MIL/uL — ABNORMAL LOW (ref 4.22–5.81)
RBC: 3.76 MIL/uL — ABNORMAL LOW (ref 4.22–5.81)
RDW: 13.8 % (ref 11.5–15.5)
RDW: 13.9 % (ref 11.5–15.5)
RDW: 13.9 % (ref 11.5–15.5)
WBC: 5.7 K/uL (ref 4.0–10.5)
WBC: 6 K/uL (ref 4.0–10.5)
WBC: 7.7 K/uL (ref 4.0–10.5)
nRBC: 0 % (ref 0.0–0.2)
nRBC: 0 % (ref 0.0–0.2)
nRBC: 0 % (ref 0.0–0.2)

## 2024-07-10 LAB — BILIRUBIN, DIRECT
Bilirubin, Direct: 0.1 mg/dL (ref 0.0–0.2)
Bilirubin, Direct: 0.3 mg/dL — ABNORMAL HIGH (ref 0.0–0.2)
Bilirubin, Direct: 0.6 mg/dL — ABNORMAL HIGH (ref 0.0–0.2)

## 2024-07-10 LAB — COMPREHENSIVE METABOLIC PANEL WITH GFR
ALT: 64 U/L — ABNORMAL HIGH (ref 0–44)
ALT: 52 U/L — ABNORMAL HIGH (ref 0–44)
ALT: 47 U/L — ABNORMAL HIGH (ref 0–44)
AST: 62 U/L — ABNORMAL HIGH (ref 15–41)
AST: 44 U/L — ABNORMAL HIGH (ref 15–41)
AST: 42 U/L — ABNORMAL HIGH (ref 15–41)
Albumin: 2.3 g/dL — ABNORMAL LOW (ref 3.5–5.0)
Albumin: 2.4 g/dL — ABNORMAL LOW (ref 3.5–5.0)
Albumin: 2.5 g/dL — ABNORMAL LOW (ref 3.5–5.0)
Alkaline Phosphatase: 119 U/L (ref 38–126)
Alkaline Phosphatase: 106 U/L (ref 38–126)
Alkaline Phosphatase: 96 U/L (ref 38–126)
Anion gap: 11 (ref 5–15)
Anion gap: 9 (ref 5–15)
Anion gap: 12 (ref 5–15)
BUN: 24 mg/dL — ABNORMAL HIGH (ref 6–20)
BUN: 29 mg/dL — ABNORMAL HIGH (ref 6–20)
BUN: 36 mg/dL — ABNORMAL HIGH (ref 6–20)
CO2: 29 mmol/L (ref 22–32)
CO2: 30 mmol/L (ref 22–32)
CO2: 30 mmol/L (ref 22–32)
Calcium: 8.7 mg/dL — ABNORMAL LOW (ref 8.9–10.3)
Calcium: 8.6 mg/dL — ABNORMAL LOW (ref 8.9–10.3)
Calcium: 8.4 mg/dL — ABNORMAL LOW (ref 8.9–10.3)
Chloride: 109 mmol/L (ref 98–111)
Chloride: 111 mmol/L (ref 98–111)
Chloride: 109 mmol/L (ref 98–111)
Creatinine, Ser: 0.73 mg/dL (ref 0.61–1.24)
Creatinine, Ser: 0.86 mg/dL (ref 0.61–1.24)
Creatinine, Ser: 1.22 mg/dL (ref 0.61–1.24)
GFR, Estimated: >60 mL/min (ref >60)
GFR, Estimated: >60 mL/min (ref >60)
GFR, Estimated: >60 mL/min (ref >60)
Glucose, Bld: 127 mg/dL — ABNORMAL HIGH (ref 70–99)
Glucose, Bld: 109 mg/dL — ABNORMAL HIGH (ref 70–99)
Glucose, Bld: 104 mg/dL — ABNORMAL HIGH (ref 70–99)
Potassium: 4.8 mmol/L (ref 3.5–5.1)
Potassium: 4.1 mmol/L (ref 3.5–5.1)
Potassium: 5 mmol/L (ref 3.5–5.1)
Sodium: 149 mmol/L — ABNORMAL HIGH (ref 135–145)
Sodium: 150 mmol/L — ABNORMAL HIGH (ref 135–145)
Sodium: 151 mmol/L — ABNORMAL HIGH (ref 135–145)
Total Bilirubin: 0.4 mg/dL (ref 0.0–1.2)
Total Bilirubin: 0.7 mg/dL (ref 0.0–1.2)
Total Bilirubin: 0.8 mg/dL (ref 0.0–1.2)
Total Protein: 5.4 g/dL — ABNORMAL LOW (ref 6.5–8.1)
Total Protein: 5.3 g/dL — ABNORMAL LOW (ref 6.5–8.1)
Total Protein: 5.3 g/dL — ABNORMAL LOW (ref 6.5–8.1)

## 2024-07-10 LAB — POCT I-STAT 7, (LYTES, BLD GAS, ICA,H+H)
Acid-Base Excess: 4 mmol/L — ABNORMAL HIGH (ref 0.0–2.0)
Acid-Base Excess: 5 mmol/L — ABNORMAL HIGH (ref 0.0–2.0)
Acid-Base Excess: 5 mmol/L — ABNORMAL HIGH (ref 0.0–2.0)
Acid-Base Excess: 3 mmol/L — ABNORMAL HIGH (ref 0.0–2.0)
Acid-Base Excess: 1 mmol/L (ref 0.0–2.0)
Bicarbonate: 34.2 mmol/L — ABNORMAL HIGH (ref 20.0–28.0)
Bicarbonate: 34.7 mmol/L — ABNORMAL HIGH (ref 20.0–28.0)
Bicarbonate: 35 mmol/L — ABNORMAL HIGH (ref 20.0–28.0)
Bicarbonate: 33.6 mmol/L — ABNORMAL HIGH (ref 20.0–28.0)
Bicarbonate: 32.6 mmol/L — ABNORMAL HIGH (ref 20.0–28.0)
Calcium, Ion: 1.29 mmol/L (ref 1.15–1.40)
Calcium, Ion: 1.3 mmol/L (ref 1.15–1.40)
Calcium, Ion: 1.29 mmol/L (ref 1.15–1.40)
Calcium, Ion: 1.23 mmol/L (ref 1.15–1.40)
Calcium, Ion: 1.21 mmol/L (ref 1.15–1.40)
HCT: 31 % — ABNORMAL LOW (ref 39.0–52.0)
HCT: 30 % — ABNORMAL LOW (ref 39.0–52.0)
HCT: 30 % — ABNORMAL LOW (ref 39.0–52.0)
HCT: 30 % — ABNORMAL LOW (ref 39.0–52.0)
HCT: 31 % — ABNORMAL LOW (ref 39.0–52.0)
Hemoglobin: 10.5 g/dL — ABNORMAL LOW (ref 13.0–17.0)
Hemoglobin: 10.2 g/dL — ABNORMAL LOW (ref 13.0–17.0)
Hemoglobin: 10.2 g/dL — ABNORMAL LOW (ref 13.0–17.0)
Hemoglobin: 10.2 g/dL — ABNORMAL LOW (ref 13.0–17.0)
Hemoglobin: 10.5 g/dL — ABNORMAL LOW (ref 13.0–17.0)
O2 Saturation: 99 %
O2 Saturation: 99 %
O2 Saturation: 98 %
O2 Saturation: 91 %
O2 Saturation: 83 %
Patient temperature: 37.6
Patient temperature: 37.6
Patient temperature: 100.2
Patient temperature: 99.1
Patient temperature: 98.6
Potassium: 4.4 mmol/L (ref 3.5–5.1)
Potassium: 4.3 mmol/L (ref 3.5–5.1)
Potassium: 4 mmol/L (ref 3.5–5.1)
Potassium: 4.5 mmol/L (ref 3.5–5.1)
Potassium: 4.9 mmol/L (ref 3.5–5.1)
Sodium: 147 mmol/L — ABNORMAL HIGH (ref 135–145)
Sodium: 149 mmol/L — ABNORMAL HIGH (ref 135–145)
Sodium: 151 mmol/L — ABNORMAL HIGH (ref 135–145)
Sodium: 149 mmol/L — ABNORMAL HIGH (ref 135–145)
Sodium: 148 mmol/L — ABNORMAL HIGH (ref 135–145)
TCO2: 37 mmol/L — ABNORMAL HIGH (ref 22–32)
TCO2: 37 mmol/L — ABNORMAL HIGH (ref 22–32)
TCO2: 38 mmol/L — ABNORMAL HIGH (ref 22–32)
TCO2: 36 mmol/L — ABNORMAL HIGH (ref 22–32)
TCO2: 36 mmol/L — ABNORMAL HIGH (ref 22–32)
pCO2 arterial: 96.2 mmHg (ref 32–48)
pCO2 arterial: 85.5 mmHg (ref 32–48)
pCO2 arterial: 96 mmHg (ref 32–48)
pCO2 arterial: 91.9 mmHg (ref 32–48)
pCO2 arterial: 97.6 mmHg (ref 32–48)
pH, Arterial: 7.163 — CL (ref 7.35–7.45)
pH, Arterial: 7.219 — ABNORMAL LOW (ref 7.35–7.45)
pH, Arterial: 7.175 — CL (ref 7.35–7.45)
pH, Arterial: 7.173 — CL (ref 7.35–7.45)
pH, Arterial: 7.132 — CL (ref 7.35–7.45)
pO2, Arterial: 186 mmHg — ABNORMAL HIGH (ref 83–108)
pO2, Arterial: 168 mmHg — ABNORMAL HIGH (ref 83–108)
pO2, Arterial: 142 mmHg — ABNORMAL HIGH (ref 83–108)
pO2, Arterial: 82 mmHg — ABNORMAL LOW (ref 83–108)
pO2, Arterial: 65 mmHg — ABNORMAL LOW (ref 83–108)

## 2024-07-10 LAB — CULTURE, BLOOD (ROUTINE X 2)
Culture: NO GROWTH
Culture: NO GROWTH
Special Requests: ADEQUATE
Special Requests: ADEQUATE

## 2024-07-10 LAB — CK TOTAL AND CKMB (NOT AT ARMC)
CK, MB: 1.5 ng/mL (ref 0.5–5.0)
CK, MB: 1.2 ng/mL (ref 0.5–5.0)
Total CK: 84 U/L (ref 49–397)
Total CK: 44 U/L — ABNORMAL LOW (ref 49–397)

## 2024-07-10 LAB — PROTIME-INR
INR: 1.2 (ref 0.8–1.2)
INR: 1.2 (ref 0.8–1.2)
INR: 1.3 — ABNORMAL HIGH (ref 0.8–1.2)
Prothrombin Time: 15.6 s — ABNORMAL HIGH (ref 11.4–15.2)
Prothrombin Time: 15.8 s — ABNORMAL HIGH (ref 11.4–15.2)
Prothrombin Time: 17.2 s — ABNORMAL HIGH (ref 11.4–15.2)

## 2024-07-10 LAB — LACTIC ACID, PLASMA
Lactic Acid, Venous: 1 mmol/L (ref 0.5–1.9)
Lactic Acid, Venous: 1.2 mmol/L (ref 0.5–1.9)
Lactic Acid, Venous: 2.3 mmol/L (ref 0.5–1.9)

## 2024-07-10 LAB — URINE CULTURE
Culture: NO GROWTH
Special Requests: NORMAL

## 2024-07-10 LAB — GLUCOSE, CAPILLARY
Glucose-Capillary: 114 mg/dL — ABNORMAL HIGH (ref 70–99)
Glucose-Capillary: 113 mg/dL — ABNORMAL HIGH (ref 70–99)
Glucose-Capillary: 121 mg/dL — ABNORMAL HIGH (ref 70–99)
Glucose-Capillary: 108 mg/dL — ABNORMAL HIGH (ref 70–99)

## 2024-07-10 LAB — CALCIUM, IONIZED
Calcium, Ionized, Serum: 4.7 mg/dL (ref 4.5–5.6)
Calcium, Ionized, Serum: 5.3 mg/dL (ref 4.5–5.6)
Calcium, Ionized, Serum: 5.2 mg/dL (ref 4.5–5.6)

## 2024-07-10 LAB — APTT
aPTT: 26 s (ref 24–36)
aPTT: 34 s (ref 24–36)
aPTT: 38 s — ABNORMAL HIGH (ref 24–36)

## 2024-07-10 LAB — MAGNESIUM
Magnesium: 2 mg/dL (ref 1.7–2.4)
Magnesium: 2.1 mg/dL (ref 1.7–2.4)
Magnesium: 2 mg/dL (ref 1.7–2.4)

## 2024-07-10 LAB — TRIGLYCERIDES: Triglycerides: 26 mg/dL

## 2024-07-10 LAB — TROPONIN I (HIGH SENSITIVITY): Troponin I (High Sensitivity): 32 ng/L — ABNORMAL HIGH (ref <18)

## 2024-07-10 LAB — FIBRINOGEN
Fibrinogen: >800 mg/dL — ABNORMAL HIGH (ref 210–475)
Fibrinogen: >800 mg/dL — ABNORMAL HIGH (ref 210–475)
Fibrinogen: 703 mg/dL — ABNORMAL HIGH (ref 210–475)

## 2024-07-10 LAB — PHENYTOIN LEVEL, TOTAL: Phenytoin Lvl: 7.9 ug/mL — ABNORMAL LOW (ref 10.0–20.0)

## 2024-07-10 LAB — PHOSPHORUS
Phosphorus: 5.6 mg/dL — ABNORMAL HIGH (ref 2.5–4.6)
Phosphorus: 4.9 mg/dL — ABNORMAL HIGH (ref 2.5–4.6)
Phosphorus: 7.6 mg/dL — ABNORMAL HIGH (ref 2.5–4.6)

## 2024-07-10 LAB — PREPARE RBC (CROSSMATCH)

## 2024-07-10 SURGERY — SURGICAL PROCUREMENT, ORGAN
Anesthesia: General

## 2024-07-10 MED ORDER — EPINEPHRINE HCL 5 MG/250ML IV SOLN IN NS
0.5000 ug/min | INTRAVENOUS | Status: DC
  Administered 2024-07-10: 0.5 ug/min via INTRAVENOUS
  Filled 2024-07-10: qty 250

## 2024-07-10 MED ORDER — LEVOTHYROXINE SODIUM 100 MCG/5ML IV SOLN
25.0000 ug | Freq: Every day | INTRAVENOUS | Status: DC
  Administered 2024-07-10: 25 ug via INTRAVENOUS
  Filled 2024-07-10: qty 5

## 2024-07-10 MED ORDER — EPINEPHRINE 1 MG/10ML IJ SOSY
0.5000 mg | PREFILLED_SYRINGE | Freq: Once | INTRAMUSCULAR | Status: AC
  Administered 2024-07-10: 0.5 mg via INTRAVENOUS

## 2024-07-10 MED ORDER — SODIUM CHLORIDE 0.9 % IV SOLN: 2.0000 g | Freq: Three times a day (TID) | INTRAVENOUS | Status: DC

## 2024-07-10 MED ORDER — VASOPRESSIN 20 UNITS/100 ML INFUSION FOR SHOCK
INTRAVENOUS | Status: AC
Start: 2024-07-10 — End: 2024-07-10
  Filled 2024-07-10: qty 100

## 2024-07-10 MED ORDER — SODIUM BICARBONATE 8.4 % IV SOLN: INTRAVENOUS | Status: DC

## 2024-07-10 MED ORDER — EPINEPHRINE 1 MG/10ML IJ SOSY
1.0000 mg | PREFILLED_SYRINGE | Freq: Once | INTRAMUSCULAR | Status: AC
  Administered 2024-07-10: 1 mg via INTRAVENOUS

## 2024-07-10 MED ORDER — SODIUM BICARBONATE 8.4 % IV SOLN: 50.0000 meq | Freq: Once | INTRAVENOUS | Status: AC

## 2024-07-10 MED ORDER — EPINEPHRINE 1 MG/10ML IJ SOSY: 0.5000 mg | PREFILLED_SYRINGE | Freq: Once | INTRAMUSCULAR | Status: AC

## 2024-07-10 MED ORDER — EPINEPHRINE 1 MG/10ML IJ SOSY: 1.0000 mg | PREFILLED_SYRINGE | Freq: Once | INTRAMUSCULAR | Status: AC

## 2024-07-10 MED ORDER — ATROPINE SULFATE 1 MG/10ML IJ SOSY: 1.0000 mg | PREFILLED_SYRINGE | Freq: Once | INTRAMUSCULAR | Status: AC

## 2024-07-10 MED ORDER — NOREPINEPHRINE 4 MG/250ML-% IV SOLN
0.0000 ug/min | INTRAVENOUS | Status: DC
  Administered 2024-07-10: 2 ug/min via INTRAVENOUS
  Filled 2024-07-10: qty 250

## 2024-07-10 MED ORDER — HEPARIN SODIUM 10000 UNIT/ML FOR ORGAN DONATION
300.0000 [IU]/kg | INTRAMUSCULAR | Status: DC
  Filled 2024-07-10: qty 3

## 2024-07-10 MED ORDER — SODIUM CHLORIDE 0.9% IV SOLUTION: Freq: Once | INTRAVENOUS | Status: DC

## 2024-07-10 MED ORDER — EPINEPHRINE 1 MG/10ML IJ SOSY
PREFILLED_SYRINGE | INTRAMUSCULAR | Status: AC
  Administered 2024-07-10: 1 mg via INTRAVENOUS
  Filled 2024-07-10: qty 10

## 2024-07-10 MED ORDER — SODIUM BICARBONATE 8.4 % IV SOLN
INTRAVENOUS | Status: AC
  Administered 2024-07-10: 50 meq via INTRAVENOUS
  Filled 2024-07-10: qty 50

## 2024-07-10 MED ORDER — FREE WATER
200.0000 mL | Freq: Four times a day (QID) | Status: DC
  Administered 2024-07-10: 200 mL

## 2024-07-10 MED ORDER — NOREPINEPHRINE 16 MG/250ML-% IV SOLN
0.0000 ug/min | INTRAVENOUS | Status: DC
  Administered 2024-07-10: 25 ug/min via INTRAVENOUS
  Filled 2024-07-10: qty 250

## 2024-07-10 MED ORDER — LACTATED RINGERS IV SOLN: INTRAVENOUS | Status: DC

## 2024-07-10 MED ORDER — ALBUMIN HUMAN 25 % IV SOLN
25.0000 g | Freq: Once | INTRAVENOUS | Status: AC
  Administered 2024-07-10: 25 g via INTRAVENOUS
  Filled 2024-07-10: qty 100

## 2024-07-10 MED ORDER — VASOPRESSIN 20 UNITS/100 ML INFUSION FOR SHOCK: 0.0000 [IU]/min | INTRAVENOUS | Status: DC

## 2024-07-10 MED ORDER — VASOPRESSIN 20 UNITS/100 ML INFUSION FOR SHOCK
INTRAVENOUS | Status: AC
  Administered 2024-07-10: 0.03 [IU]/min via INTRAVENOUS
  Filled 2024-07-10: qty 100

## 2024-07-10 MED ORDER — ALBUMIN HUMAN 5 % IV SOLN
25.0000 g | Freq: Once | INTRAVENOUS | Status: AC
  Administered 2024-07-10: 25 g via INTRAVENOUS
  Filled 2024-07-10: qty 500

## 2024-07-10 MED ORDER — SODIUM BICARBONATE 8.4 % IV SOLN
50.0000 meq | Freq: Once | INTRAVENOUS | Status: AC
  Administered 2024-07-10: 50 meq via INTRAVENOUS

## 2024-07-10 MED ORDER — LEVOTHYROXINE SODIUM 100 MCG/5ML IV SOLN: 25.0000 ug | Freq: Every day | INTRAVENOUS | Status: DC

## 2024-07-10 MED ORDER — ALBUMIN HUMAN 25 % IV SOLN: 25.0000 g | Freq: Once | INTRAVENOUS | Status: DC

## 2024-07-10 MED ORDER — SODIUM BICARBONATE 8.4 % IV SOLN
INTRAVENOUS | Status: AC
  Administered 2024-07-10: 50 meq via INTRAVENOUS
  Filled 2024-07-10: qty 100

## 2024-07-10 MED ORDER — CALCIUM CHLORIDE 10 % IV SOLN
INTRAVENOUS | Status: AC
  Filled 2024-07-10: qty 10

## 2024-07-10 MED ORDER — ATROPINE SULFATE 1 MG/10ML IJ SOSY
PREFILLED_SYRINGE | INTRAMUSCULAR | Status: AC
  Administered 2024-07-10: 1 mg via INTRAVENOUS
  Filled 2024-07-10: qty 20

## 2024-07-10 MED ORDER — VANCOMYCIN HCL 2000 MG/400ML IV SOLN
2000.0000 mg | Freq: Once | INTRAVENOUS | Status: AC
  Administered 2024-07-10: 2000 mg via INTRAVENOUS
  Filled 2024-07-10: qty 400

## 2024-07-10 MED ORDER — EPINEPHRINE 1 MG/10ML IJ SOSY
PREFILLED_SYRINGE | INTRAMUSCULAR | Status: AC
  Administered 2024-07-10: 0.5 mg via INTRAVENOUS
  Filled 2024-07-10: qty 40

## 2024-07-10 MED ORDER — EPINEPHRINE HCL 5 MG/250ML IV SOLN IN NS: 0.5000 ug/min | INTRAVENOUS | Status: DC

## 2024-07-10 MED ORDER — VANCOMYCIN HCL 1750 MG/350ML IV SOLN
1750.0000 mg | Freq: Two times a day (BID) | INTRAVENOUS | Status: DC
  Filled 2024-07-10: qty 350

## 2024-07-10 MED ORDER — FUROSEMIDE 10 MG/ML IJ SOLN
80.0000 mg | Freq: Once | INTRAMUSCULAR | Status: AC
  Administered 2024-07-10: 80 mg via INTRAVENOUS
  Filled 2024-07-10: qty 8

## 2024-07-10 MED ORDER — ATROPINE SULFATE 1 MG/10ML IJ SOSY
PREFILLED_SYRINGE | INTRAMUSCULAR | Status: AC
  Filled 2024-07-10: qty 10

## 2024-07-10 MED ORDER — EPINEPHRINE 1 MG/10ML IJ SOSY
PREFILLED_SYRINGE | INTRAMUSCULAR | Status: AC
  Administered 2024-07-10: 0.5 mg via INTRAVENOUS
  Filled 2024-07-10: qty 10

## 2024-07-10 MED ORDER — HYDROCORTISONE SOD SUC (PF) 100 MG IJ SOLR
100.0000 mg | Freq: Three times a day (TID) | INTRAMUSCULAR | Status: DC
  Administered 2024-07-10: 100 mg via INTRAVENOUS
  Filled 2024-07-10: qty 2

## 2024-07-10 MED ORDER — PIPERACILLIN-TAZOBACTAM 3.375 G IVPB
3.3750 g | Freq: Three times a day (TID) | INTRAVENOUS | Status: DC
  Administered 2024-07-10: 3.375 g via INTRAVENOUS
  Filled 2024-07-10: qty 50

## 2024-07-10 MED ORDER — STERILE WATER FOR INJECTION IV SOLN
INTRAVENOUS | Status: DC
  Filled 2024-07-10 (×2): qty 1000

## 2024-07-10 MED ORDER — CALCIUM GLUCONATE-NACL 1-0.675 GM/50ML-% IV SOLN
1.0000 g | Freq: Once | INTRAVENOUS | Status: AC
  Administered 2024-07-10: 1000 mg via INTRAVENOUS
  Filled 2024-07-10: qty 50

## 2024-07-11 LAB — BPAM RBC
Blood Product Expiration Date: 202508312359
Blood Product Expiration Date: 202508312359
Blood Product Expiration Date: 202508312359
Blood Product Expiration Date: 202509022359
Blood Product Expiration Date: 202509022359
Unit Type and Rh: 6200
Unit Type and Rh: 6200
Unit Type and Rh: 6200
Unit Type and Rh: 6200
Unit Type and Rh: 6200

## 2024-07-11 LAB — TYPE AND SCREEN
ABO/RH(D): A POS
Antibody Screen: NEGATIVE
PT AG Type: POSITIVE
Unit division: 0
Unit division: 0
Unit division: 0
Unit division: 0
Unit division: 0

## 2024-07-12 LAB — CULTURE, RESPIRATORY W GRAM STAIN

## 2024-07-12 LAB — CALCIUM, IONIZED
Calcium, Ionized, Serum: 4.9 mg/dL (ref 4.5–5.6)
Calcium, Ionized, Serum: 4.4 mg/dL — ABNORMAL LOW (ref 4.5–5.6)

## 2024-07-14 LAB — PHENYTOIN LEVEL, FREE AND TOTAL: Phenytoin, Total: 8.2 ug/mL — ABNORMAL LOW (ref 10.0–20.0)

## 2024-07-14 LAB — CULTURE, BLOOD (ROUTINE X 2)
Culture: NO GROWTH
Culture: NO GROWTH
Special Requests: ADEQUATE
Special Requests: ADEQUATE

## 2024-07-26 MED FILL — Medication: Qty: 1 | Status: AC

## 2024-07-28 NOTE — Procedures (Addendum)
 Patient Name: Dale Mason  MRN: [id number]  Epilepsy Attending: Arlin MALVA Krebs  Referring Physician/Provider: Dr. Aisha Seals Date: 07/05/2024 Duration: 5 hours 32 minutes, 07/05/2024 0237 to 0810  Patient history:  58 year old male presenting with altered mental status and seizure activity in the context of anaphylactic shock.  Rapid EEG was performed to evaluate for seizure.  Of note, report is delayed because I was notified of study today and was not on-call but this was performed  Level of alertness:  comatose  Technical aspects: This EEG was obtained using a 10 lead EEG system positioned circumferentially without any parasagittal coverage (rapid EEG). Computer selected EEG is reviewed as  well as background features and all clinically significant events.  Description: EEG showed burst suppression pattern with bursts of amplitude polymorphic 3 to 6 Hz theta- delta slowing lasting 2 to 4 seconds alternating with 15 to 30 seconds of generalized EEG suppression.  Hyperventilation and photic stimulation were not performed.     ABNORMALITY - Burst suppression, generalized  IMPRESSION: This limited ceribell EEG was suggestive of profound diffuse encephalopathy.  No seizures or epileptiform discharges were seen throughout the recording.   Danyele Smejkal O Telecia Larocque

## 2024-08-02 NOTE — Progress Notes (Addendum)
 Pharmacy Antibiotic Note  Dale Mason is a 58 y.o. male admitted on 07/04/2024 with pneumonia.  Pharmacy has been consulted for vancomycin  and zosyn  dosing.  Plan: Zosyn  3.376g IV q8h -EI Vancomycin  2g IV x1 then 1750 IV q12h  Addendum: Changing Zosyn  to Cefepime 2g IV q8h per [facility] request - approved change by CCM   Height: 6' 4 (193 cm) Weight: 92.1 kg (203 lb 0.7 oz) IBW/kg (Calculated) : 86.8  Temp (24hrs), Avg:99.5 F (37.5 C), Min:97.9 F (36.6 C), Max:101 F (38.3 C)  Recent Labs  Lab 07/09/24 0331 07/09/24 1245 07/09/24 1805 07-31-2024 0018 July 31, 2024 0020 Jul 31, 2024 0601 July 31, 2024 0602  WBC 10.3 10.5 10.8* 6.0  --  5.7  --   CREATININE 0.52* 0.55* 0.53* 0.73  --  0.86  --   LATICACIDVEN 0.6 0.8 0.9  --  1.0  --  1.2    Estimated Creatinine Clearance: 116.4 mL/min (by C-G formula based on SCr of 0.86 mg/dL).    No Known Allergies  Antimicrobials this admission: Vanc 2024-07-31 >> Zosyn  31-Jul-2024 >>  Dose adjustments this admission:   Microbiology results: pending  Thank you for allowing pharmacy to be a part of this patient's care.  Harlene Boga, PharmD, BCPS, BCCCP Please refer to [facility] for [facility] Pharmacy numbers 2024-07-31 11:09 AM

## 2024-08-02 NOTE — Progress Notes (Signed)
 PCCM INTERVAL PROGRESS NOTE   Endotracheal tube migrated above the level of the clavicular heads.  Evaluated with Glidescope. Unable to fully determine status of ETT, but ETT cuff is definitely above the vocal cords. Unable to advance tube. Bronchoscope advanced. Distal portion of the tube is in the airway. Tube was able to be advanced over the bronchoscope to a position approximately 5-6 cm above carina.    Dale Mason, AGACNP-BC [facility]  See Amion for personal pager PCCM on call pager [phone] until 7pm. Please call Elink 7p-7a. [phone]  07-21-24 12:10 AM

## 2024-08-02 NOTE — Progress Notes (Signed)
 NAME:  Dale Mason, MRN:  [id number], DOB:  01-Jul-1966, LOS: 6 ADMISSION DATE:  07/04/2024, CONSULTATION DATE:  07/08/24 REFERRING MD:  EDP, CHIEF COMPLAINT:  anaphylaxis   History of Present Illness:  58 yo male working outside today when he sustained multiple wasp stings. Pt has sustained wasp stings previously without issue. However after being stung for a second time on his head this evening he was having shortness of breath, feeling of weakness with difficulty walking and wobbling with near falls. EMS was called and upon arrival he was found unresponsive, diaphoretic with agonal breathing and bradycardia, no palpable pulse and ALCS was started, airway established in the field. Pt has multiple rounds of epi between home and hospital pea and asystole at pulse checks, ROSC ultimately achieved and pt was started in epi infusion. EDP states he is adding levophed  at this time as well.   Cvc and arterial line were placed. Labs revealed acidosis, transaminitis and aki. CCM was asked for admission in light of the above.   All history is obtained from chart review, ROS unobtainable 2/2 intubated status.   Upon arrival to [facility] ICU, patient noted to have myoclonic jerking/seizure activity. STAT Ativan  2mg  given ordered by Ema CCM MD Mohon CT-scan of head obtained- no intracranial abnormality noted. Patient's most recent ABG- PH 7.138, Pco2  Update: pt began having seizure like activity. Last sz noted per wife was 2007. He is diligent about his medications and has not missed a dose of dilantin . Eeg and neuro consult ordered. Dilantin  level sent.    Pertinent  Medical History  Sz d/o  Significant Hospital Events: Including procedures, antibiotic start and stop dates in addition to other pertinent events   Presented to [facility] with anaphylactic shock 8/3 Transferred to [facility] for admission to ICU 8/3 8/7 Off all sedation with no spontaneous activity. MRI brain with severe anoxic brain injury   Interim History  / Subjective:   Objective    Blood pressure (!) 86/52, pulse 87, temperature 97.9 F (36.6 C), temperature source Bladder, resp. rate (!) 28, height 6' 4 (1.93 m), weight 92.1 kg, SpO2 100%.    Vent Mode: PRVC FiO2 (%):  [40 %-100 %] 100 % Set Rate:  [24 bmp-28 bmp] 28 bmp Vt Set:  [620 mL-690 mL] 690 mL PEEP:  [5 cmH20-8 cmH20] 8 cmH20 Plateau Pressure:  [14 cmH20-26 cmH20] 23 cmH20   Intake/Output Summary (Last 24 hours) at 07/20/2024 0945 Last data filed at 20-Jul-2024 0900 Gross per 24 hour  Intake 2391.78 ml  Output 1835 ml  Net 556.78 ml   Filed Weights   07/07/24 0424 07/08/24 0500 07/09/24 0500  Weight: 90.3 kg 90.6 kg 92.1 kg    Examination: Physical Exam Constitutional:      Comments: Unresponsive   HENT:     Nose: Nose normal.     Mouth/Throat:     Mouth: Mucous membranes are moist.  Cardiovascular:     Rate and Rhythm: Normal rate.     Pulses: Normal pulses.  Pulmonary:     Effort: Pulmonary effort is normal.  Abdominal:     General: Abdomen is flat.  Skin:    General: Skin is warm.     Capillary Refill: Capillary refill takes less than 2 seconds.  Neurological:     Comments: unresponsive      Resolved problem list   Assessment and Plan   48 M with a past medical history of seizures post traumatic head injury, presenting after being  stung by multiple wasps with presentation concerning for cardiac arrest. He was noted to have convulsions and stiff extrememties followed by unresponsiveness with no palpable pulse. ICU evaluation with concern for anaphylactic shock vs septic shock.   Neuro #Seizures  #Hx of head trauma  #Toxic metabolic encephalopathy  Off sedation with minimal activity. Concerning for anoxic brain injury - MRI  consistent with severe anoxic brain injury. Family considering donation. Case sent to [facility] for further assessment given reported seizure around time of cardiac arrest    Cardiovascular  Anaphylactic shock vs septic shock   Type II NSTEMI   CTX for possible aspiration even during episode of unresponsiveness. Plan for a 5 day course  - blood cultures so far no growth  - Troponin elevation likely due to demand but will recheck now. TTE reassuring  - Stop steroids - D/C Pressors  - Stop H1 blocker  - Start labetalol  10mg  Q8h PRN for systolic > 180   Pulmonary  #Pneumothorax  #Acute hypoxic respiratory failure #Bullous emphysema  Currently on low vent settings  Continue vent ACVC with TV 6-8 -Duo nebs Q4h - Overnight developed pneumothorax and a chest tube was placed   GI  #transaminits (likely due to shock) - Continue tube feeds  - Continue PPI   Renal  #AKI   #metabolic acidosis  #hyperkalemia  Likely prerenal/ Renal due to shock and possible ATN  - monitor Cr, avoid nephrotoxins    Endo  #Hyperglycemia  SSI, switched to drip and back to SSI today due to uncontrolled hyperglycemia   Onc  Continue lovenox    ID  Continue CTX for 5 days      Best Practice (right click and Reselect all SmartList Selections daily)   Diet/type: Tube feeds  DVT prophylaxis Lovenox  Pressure ulcer(s): N/A GI prophylaxis:  PPI  Lines: D/C centrla line today  Foley:  Yes, and it is still needed Code Status:  full code Last date of multidisciplinary goals of care discussion [--]  Labs   CBC: Recent Labs  Lab 07/04/24 2045 07/05/24 0007 07/05/24 0500 07/05/24 1030 07/06/24 0430 07/06/24 1146 07/09/24 0331 07/09/24 0510 07/09/24 1245 07/09/24 1357 07/09/24 1805 07/09/24 1813 07/11/2024 0018 July 11, 2024 0108 07-11-2024 0220 2024/07/11 0601  WBC 9.4  --  34.8*  --  17.9*  --  10.3  --  10.5  --  10.8*  --  6.0  --   --  5.7  NEUTROABS 2.5  --  31.2*  --  12.7*  --  8.2*  --   --   --   --   --   --   --   --   --   HGB 16.0   < > 14.5   < > 13.5   < > 10.8*   < > 10.5*   < > 11.0* 10.2* 11.8* 10.5* 10.2* 10.9*  10.2*  HCT 48.8   < > 41.4   < > 38.4*   < > 33.2*   < > 32.0*   < > 34.7* 30.0*  38.4* 31.0* 30.0* 35.3*  30.0*  MCV 97.6  --  93.0  --  91.0  --  98.5  --  97.9  --  99.4  --  102.1*  --   --  103.2*  PLT 305  --  304  --  196  --  126*  --  130*  --  123*  --  126*  --   --  112*   < > =  values in this interval not displayed.    Basic Metabolic Panel: Recent Labs  Lab 07/09/24 0331 07/09/24 0510 07/09/24 1245 07/09/24 1357 07/09/24 1805 07/09/24 1813 08/09/2024 0018 August 09, 2024 0108 Aug 09, 2024 0220 Aug 09, 2024 0601  NA 145   < > 147*   < > 147* 145 149* 147* 149* 150*  151*  K 5.2*   < > 4.9   < > 4.5 4.5 4.8 4.4 4.3 4.1  4.0  CL 107  --  107  --  106  --  109  --   --  111  CO2 33*  --  32  --  32  --  29  --   --  30  GLUCOSE 157*  --  145*  --  192*  --  127*  --   --  109*  BUN 19  --  21*  --  20  --  24*  --   --  29*  CREATININE 0.52*  --  0.55*  --  0.53*  --  0.73  --   --  0.86  CALCIUM  8.5*  --  8.8*  --  8.9  --  8.7*  --   --  8.6*  MG 2.2  --  2.0  --  2.0  --  2.0  --   --  2.1  PHOS 3.4  --  2.0*  --  3.2  --  5.6*  --   --  4.9*   < > = values in this interval not displayed.   GFR: Estimated Creatinine Clearance: 116.4 mL/min (by C-G formula based on SCr of 0.86 mg/dL). Recent Labs  Lab 07/09/24 1245 07/09/24 1805 08-09-24 0018 08-09-24 0020 August 09, 2024 0601 2024-08-09 0602  WBC 10.5 10.8* 6.0  --  5.7  --   LATICACIDVEN 0.8 0.9  --  1.0  --  1.2    Liver Function Tests: Recent Labs  Lab 07/09/24 0331 07/09/24 1245 07/09/24 1805 08/09/2024 0018 2024/08/09 0601  AST 38 45* 64* 62* 44*  ALT 34 39 54* 64* 52*  ALKPHOS 71 77 96 119 106  BILITOT <0.2 0.3 0.2 0.4 0.7  PROT 5.3* 5.3* 5.4* 5.4* 5.3*  ALBUMIN  2.3* 2.2* 2.2* 2.3* 2.4*   No results for input(s): LIPASE, AMYLASE in the last 168 hours. No results for input(s): AMMONIA in the last 168 hours.  ABG    Component Value Date/Time   PHART 7.175 (LL) 08-09-2024 0601   PCO2ART 96.0 (HH) 08/09/24 0601   PO2ART 142 (H) August 09, 2024 0601   HCO3 35.0 (H) Aug 09, 2024 0601   TCO2  38 (H) 08-09-24 0601   ACIDBASEDEF 3.0 (H) 07/06/2024 1146   O2SAT 98 08/09/24 0601     Coagulation Profile: Recent Labs  Lab 07/09/24 0331 07/09/24 1245 07/09/24 1805 09-Aug-2024 0018 2024-08-09 0601  INR 1.1 1.1 1.2 1.2 1.2    Cardiac Enzymes: Recent Labs  Lab 07/05/24 0001 07/05/24 0500 07/09/24 0331 07/09/24 1245 Aug 09, 2024 0018  CKTOTAL 734* 804* 173 114 84  CKMB  --   --  3.6 1.6 1.5    HbA1C: No results found for: HGBA1C  CBG: Recent Labs  Lab 07/09/24 1548 07/09/24 1959 2024-08-09 0013 2024-08-09 0357 August 09, 2024 0732  GLUCAP 148* 191* 121* 114* 113*    Review of Systems:   Unobtainable 2/2 intubated and sedated status  Past Medical History:  He,  has a past medical history of Seizures (HCC).   Surgical History:  History reviewed. No pertinent surgical history.  Social History:   reports that he has been smoking. He has never used smokeless tobacco. He reports current alcohol use.  Drug: Marijuana.   Family History:  His family history is not on file.   Allergies No Known Allergies   Home Medications  Prior to Admission medications   Medication Sig Start Date End Date Taking? Authorizing Provider  amoxicillin  (AMOXIL ) 500 MG capsule Take 1 capsule (500 mg total) by mouth 3 (three) times daily. 11/25/21   Pollina, Christopher J, MD  amoxicillin -clavulanate (AUGMENTIN ) 875-125 MG tablet Take 1 tablet by mouth every 12 (twelve) hours. 08/13/23   Stuart Vernell Norris, PA-C  HYDROcodone -acetaminophen  (NORCO/VICODIN) 5-325 MG tablet Take 1 tablet by mouth every 4 (four) hours as needed. 02/25/16   Idol, Julie, PA-C  naproxen  (NAPROSYN ) 500 MG tablet Take 1 tablet (500 mg total) by mouth 2 (two) times daily as needed. 09/26/19   Rancour, Garnette, MD  phenytoin  (DILANTIN ) 100 MG ER capsule Take 200-300 mg by mouth 2 (two) times daily. Take 2 capsules in the morning and 3 capsules at night.    [provider]  phenytoin  (DILANTIN ) 100 MG ER capsule  Take by mouth.    [provider]  predniSONE  (DELTASONE ) 20 MG tablet Take 2 tablets (40 mg total) by mouth daily with breakfast. 11/25/21   Haze Lonni PARAS, MD  predniSONE  (DELTASONE ) 20 MG tablet Take 2 tablets (40 mg total) by mouth daily with breakfast. 08/13/23   Stuart Vernell Norris, PA-C

## 2024-08-02 NOTE — Procedures (Signed)
 Extubation Procedure Note  Patient Details:   Name: Dale Mason DOB: [birth year] MRN: [id number]   Airway Documentation:  Airway 7.5 mm (Active)  Secured at (cm) 29 cm 26-Jul-2024 1357  Measured From Lips Jul 26, 2024 1357  Secured Location Center 2024-07-26 1357  Secured By [employer] 2024/07/26 1357  Bite Block No 07-26-2024 0345  Tube Holder Repositioned Yes 26-Jul-2024 1033  Prone position No July 26, 2024 0345  Cuff Pressure (cm H2O) Green OR 18-26 CmH2O 2024-07-26 0819  Site Condition Cool;Dry 07-26-2024 1357   Vent end date: (not recorded) Vent end time: (not recorded)   Evaluation  O2 sats: transiently fell during during procedure Complications: No apparent complications Patient did tolerate procedure well. Bilateral Breath Sounds: Rhonchi, Diminished   Pt expired and was extubated post expiration.  [name] July 26, 2024, 3:17 PM

## 2024-08-02 NOTE — Progress Notes (Signed)
 Patient Asystole, CCM MD @ bedside. 2 RN's verified. Time of death called @ 1512 by Zaida Mason, MD.

## 2024-08-02 NOTE — Progress Notes (Signed)
 ABG given to [facility] RN via phone.

## 2024-08-02 NOTE — Death Summary Note (Signed)
 DEATH SUMMARY   Patient Details  Name: Dale Mason MRN: [id number] DOB: [birth year] ERE:[facility]  Admission/Discharge Information   Admit Date:  07/07/2024  Date of Death:    Time of Death:    Length of Stay: 6   Principle Cause of death: Acute respiratory failure  Hospital Diagnoses: Principal Problem:   Anaphylactic shock   Hospital Course:  58 yo male working outside today when he sustained multiple wasp stings. Pt has sustained wasp stings previously without issue. However after being stung for a second time on his head this evening he was having shortness of breath, feeling of weakness with difficulty walking and wobbling with near falls. EMS was called and upon arrival he was found unresponsive, diaphoretic with agonal breathing and bradycardia, no palpable pulse and ALCS was started, airway established in the field. Pt has multiple rounds of epi between home and hospital pea and asystole at pulse checks, ROSC ultimately achieved and pt was started in epi infusion. EDP states he is adding levophed  at this time as well.    Cvc and arterial line were placed. Labs revealed acidosis, transaminitis and aki. CCM was asked for admission in light of the above.    All history is obtained from chart review, ROS unobtainable 2/2 intubated status.    Upon arrival to [facility] ICU, patient noted to have myoclonic jerking/seizure activity. STAT Ativan  2mg  given ordered by Ema CCM MD Mohon CT-scan of head obtained- no intracranial abnormality noted. Patient's most recent ABG- PH 7.138, Pco2   pt began having seizure like activity. Last sz noted per wife was 2007. He is diligent about his medications and has not missed a dose of dilantin . Eeg and neuro consult ordered. Dilantin  level sent.   He was stabilized in the ICU. Weaned off pressors. Seizures supressed while on continuous EEG. Unfortunately, sedation was weaned off and the patient had no response at all. MRI brain  concerning for anoxic brain injury. Family aware and agreed for organ donation. Overnight 2024-07-13 he developed worsening respiratory failure and was noted to have a pneumothorax. A chest tube was placed to drain pneumothorax. CXR post placement with evidence of bilateral pulmonary edema and/or multilobar pneumonia with increasing oxygen requirements. Unfortunately despite very aggressive measures to maintain his pressure and oxygenation, patient passed away at 15:12. Family informed at bedside. Honor bridge aware outside the patient's room.    Assessment and Plan: No notes have been filed under this hospital service. Service: Hospitalist        Procedures:  Central line Arterial line  Endotracheal intubation    Consultations:  Neurology   The results of significant diagnostics from this hospitalization (including imaging, microbiology, ancillary and laboratory) are listed below for reference.   Significant Diagnostic Studies: DG CHEST PORT 1 VIEW Result Date: 07-13-24 CLINICAL DATA:  Pneumothorax. EXAM: PORTABLE CHEST 1 VIEW COMPARISON:  Radiograph and CT yesterday FINDINGS: Endotracheal tube tip 4.5 cm from the carina. Enteric tube tip below the diaphragm not included in the field of view. Pigtail catheter projects over the right upper lung zone. Bullous emphysematous changes in the right upper lobe but no discrete pneumothorax demonstrated by radiograph. Increasing heterogeneous bilateral lung opacities. There may be a small left pleural effusion. Stable heart size and mediastinal contours. IMPRESSION: 1. Pigtail catheter projects over the right upper lung zone. Bullous emphysematous changes in the right upper lobe but no discrete pneumothorax demonstrated by radiograph. 2. Increasing heterogeneous bilateral lung opacities, may be pulmonary edema, infection, or  ARDS. 3. Stable support apparatus. Electronically Signed   By: Andrea Gasman M.D.   On: 2024-07-18 11:13   CT ABDOMEN PELVIS W  CONTRAST Addendum Date: 07/09/2024 ADDENDUM REPORT: 07/09/2024 23:28 ADDENDUM: There are single-vessel calcific plaques in the LAD coronary artery. Electronically Signed   By: Francis Quam M.D.   On: 07/09/2024 23:28   Addendum Date: 07/09/2024 ADDENDUM REPORT: 07/09/2024 21:27 ADDENDUM: Critical Value/emergent results were called by telephone at the time of interpretation on 07/09/2024 at 9:20 pm to provider DR. PALIWAL, who verbally acknowledged these results. Electronically Signed   By: Francis Quam M.D.   On: 07/09/2024 21:27   Result Date: 07/09/2024 CLINICAL DATA:  Heart evaluation for donation candidacy, hepatic cyst on the earlier noncontrast study. Evaluation requested but notation services. EXAM: CT ANGIOGRAPHY CHEST CT ABDOMEN AND PELVIS WITH CONTRAST TECHNIQUE: Multidetector CT imaging of the chest was performed using the standard protocol during bolus administration of intravenous contrast. Multiplanar CT image reconstructions and MIPs were obtained to evaluate the vascular anatomy. Multidetector CT imaging of the abdomen and pelvis was performed using the standard protocol during bolus administration of intravenous contrast. RADIATION DOSE REDUCTION: This exam was performed according to the departmental dose-optimization program which includes automated exposure control, adjustment of the mA and/or kV according to patient size and/or use of iterative reconstruction technique. CONTRAST:  75mL OMNIPAQUE  IOHEXOL  350 MG/ML SOLN COMPARISON:  Chest, abdomen and pelvis CT without contrast today at 2:12 p.m. FINDINGS: CTA CHEST FINDINGS Cardiovascular: There is patchy calcific plaque in the proximal LAD coronary artery. No other discrete coronary calcifications are seen allowing for respiratory motion. The cardiac size is normal. There is a small anterior pericardial effusion seen previously. The pulmonary arteries are normal in caliber. There is acute embolic disease with clot partially filling the distal  right main pulmonary artery, nonoccluding thrombus extending a short distance into the right upper lobe main artery, nonoccluding thrombus also seen in the right middle lobe lateral segmental artery, right lower lobe main artery, and in 2 subsegmental arteries to the basal right lower lobe segments. The overall clot burden is small and there are no findings of acute right heart strain, no right heart chamber expansion. The pulmonary veins are nondistended. There is a small amount of atherosclerotic soft plaque noted in aortic arch. The aorta and great vessels are otherwise normal. Mediastinum/Nodes: The tip of an ETT is again noted at the thoracic thoracic inlet, 13 cm above the carina. Layering secretions or aspirate are increasingly noted in the distal trachea and both main bronchi. Negative thyroid gland, axillary spaces and esophageal wall. No intrathoracic adenopathy. Calcified lymph nodes right hilum. NGT again passes into the stomach and abuts the far wall of the proximal gastric body. Lungs/Pleura: There is centrilobular and right greater the left upper lobe paraseptal emphysematous changes with right upper lobe bullous disease and a large right tension pneumothorax. The pneumothorax was seen previously. It is primarily anterolateral and subpulmonic in distribution. There is slightly increased mediastinal shift to the left and there is increasing depression of the right hemidiaphragm. Left lower lobe is densely consolidated as before. There are trace pleural effusions. There is patchy nodular airspace disease in the left upper and right lower lobes, atelectatic partial collapse in the posterior right middle and posterior right lower lobes No pulmonary mass is seen through the infiltrates but assessment for nodules is very limited. Musculoskeletal: There is a transverse nondisplaced body of sternum fracture again noted. There are slightly displaced right anterior  second through sixth rib fractures and  nondisplaced fractures of the right third through seventh costal cartilages and left third through fifth costal cartilage, almost certainly related to CPR. There is no spinal compression fracture. Multilevel degenerative discs and endplate Schmorl's nodes are noted. No chest wall mass or hematoma. Review of the MIP images confirms the above findings. CT ABDOMEN and PELVIS FINDINGS Hepatobiliary: There are small hypodensities in the lateral and medial aspect of hepatic segment 2 each measuring 5 mm on 3:9 and 3:16. There is a 7 mm hypodensity and a 6 mm hypodensity in segment 4B on 3:29 and 3:36. All of these are too small to characterize, statistically most likely cysts but indeterminate. Rest of the liver enhanced uniformly with mild features of steatosis. The gallbladder and bile ducts are unremarkable. Pancreas: No mass enhancement or ductal dilatation. Spleen: Mildly enlarged measuring 16 cm in length.  No mass. Adrenals/Urinary Tract: There are occasional punctate tiny caliceal stones in the left kidney. Equivocally there may be a few punctate stones in the lower pole on the right. There is no obstructing stone or hydronephrosis. There is no adrenal or renal mass. There is symmetric renal excretion. The bladder is catheterized and contracted. No obvious focal abnormality in the bladder. Stomach/Bowel: No dilatation or overt wall thickening, including the appendix. NGT as above. Vascular/Lymphatic: Right femoral central venous line terminates in the right common iliac vein. There is a left femoral arterial line terminating in the external iliac artery. Aortic atherosclerosis. No enlarged abdominal or pelvic lymph nodes. Reproductive: No prostatomegaly. Other: There is a tiny umbilical fat hernia and bilateral small inguinal fat hernias. There is no incarcerated hernia. There is trace ascites in the posterior deep pelvis. Mild mesenteric congestive changes. Mild body wall anasarca. There is no free air, free  hemorrhage or localizing inflammatory process. Musculoskeletal: Facet hypertrophy lumbar spine, most advanced at L4-5 and L5-S1. No regional skeletal fracture. Review of the MIP images confirms the above findings. IMPRESSION: 1. Acute right-sided pulmonary emboli, with small clot burden and no findings of acute right heart strain. 2. Large right tension pneumothorax with slightly increased mediastinal shift to the left and increasing depression of the right hemidiaphragm, since today's earlier exam. 3. Dense left lower lobe consolidation, patchy nodular airspace disease in the left upper and right lower lobes, and partial atelectatic collapse of the right middle and lower lobes. 4. Emphysema with asymmetry. 5. Aortic and coronary artery atherosclerosis. 6. ETT tip 13 cm above the carina. NGT abuts the far wall of the proximal gastric body. 7. Multiple anterior rib fractures and nondisplaced body of sternum fracture, almost certainly related to CPR. 8. Mild splenomegaly. 9. Trace ascites, mild mesenteric congestive changes and body wall anasarca. 10. Nonobstructive nephrolithiasis. 11. Small hypodensities in the liver, too small to characterize, statistically most likely cysts but indeterminate. 12. PRA is attempting to reach the ordering physician for stat notification at the time of signing. Aortic Atherosclerosis (ICD10-I70.0) and Emphysema (ICD10-J43.9). Electronically Signed: By: Francis Quam M.D. On: 07/09/2024 21:11   CT ANGIO CHEST AORTA W/CM & OR WO/CM Addendum Date: 07/09/2024 ADDENDUM REPORT: 07/09/2024 23:28 ADDENDUM: There are single-vessel calcific plaques in the LAD coronary artery. Electronically Signed   By: Francis Quam M.D.   On: 07/09/2024 23:28   Addendum Date: 07/09/2024 ADDENDUM REPORT: 07/09/2024 21:27 ADDENDUM: Critical Value/emergent results were called by telephone at the time of interpretation on 07/09/2024 at 9:20 pm to provider DR. PALIWAL, who verbally acknowledged these results.  Electronically Signed  By: Francis Quam M.D.   On: 07/09/2024 21:27   Result Date: 07/09/2024 CLINICAL DATA:  Heart evaluation for donation candidacy, hepatic cyst on the earlier noncontrast study. Evaluation requested but notation services. EXAM: CT ANGIOGRAPHY CHEST CT ABDOMEN AND PELVIS WITH CONTRAST TECHNIQUE: Multidetector CT imaging of the chest was performed using the standard protocol during bolus administration of intravenous contrast. Multiplanar CT image reconstructions and MIPs were obtained to evaluate the vascular anatomy. Multidetector CT imaging of the abdomen and pelvis was performed using the standard protocol during bolus administration of intravenous contrast. RADIATION DOSE REDUCTION: This exam was performed according to the departmental dose-optimization program which includes automated exposure control, adjustment of the mA and/or kV according to patient size and/or use of iterative reconstruction technique. CONTRAST:  75mL OMNIPAQUE  IOHEXOL  350 MG/ML SOLN COMPARISON:  Chest, abdomen and pelvis CT without contrast today at 2:12 p.m. FINDINGS: CTA CHEST FINDINGS Cardiovascular: There is patchy calcific plaque in the proximal LAD coronary artery. No other discrete coronary calcifications are seen allowing for respiratory motion. The cardiac size is normal. There is a small anterior pericardial effusion seen previously. The pulmonary arteries are normal in caliber. There is acute embolic disease with clot partially filling the distal right main pulmonary artery, nonoccluding thrombus extending a short distance into the right upper lobe main artery, nonoccluding thrombus also seen in the right middle lobe lateral segmental artery, right lower lobe main artery, and in 2 subsegmental arteries to the basal right lower lobe segments. The overall clot burden is small and there are no findings of acute right heart strain, no right heart chamber expansion. The pulmonary veins are nondistended. There  is a small amount of atherosclerotic soft plaque noted in aortic arch. The aorta and great vessels are otherwise normal. Mediastinum/Nodes: The tip of an ETT is again noted at the thoracic thoracic inlet, 13 cm above the carina. Layering secretions or aspirate are increasingly noted in the distal trachea and both main bronchi. Negative thyroid gland, axillary spaces and esophageal wall. No intrathoracic adenopathy. Calcified lymph nodes right hilum. NGT again passes into the stomach and abuts the far wall of the proximal gastric body. Lungs/Pleura: There is centrilobular and right greater the left upper lobe paraseptal emphysematous changes with right upper lobe bullous disease and a large right tension pneumothorax. The pneumothorax was seen previously. It is primarily anterolateral and subpulmonic in distribution. There is slightly increased mediastinal shift to the left and there is increasing depression of the right hemidiaphragm. Left lower lobe is densely consolidated as before. There are trace pleural effusions. There is patchy nodular airspace disease in the left upper and right lower lobes, atelectatic partial collapse in the posterior right middle and posterior right lower lobes No pulmonary mass is seen through the infiltrates but assessment for nodules is very limited. Musculoskeletal: There is a transverse nondisplaced body of sternum fracture again noted. There are slightly displaced right anterior second through sixth rib fractures and nondisplaced fractures of the right third through seventh costal cartilages and left third through fifth costal cartilage, almost certainly related to CPR. There is no spinal compression fracture. Multilevel degenerative discs and endplate Schmorl's nodes are noted. No chest wall mass or hematoma. Review of the MIP images confirms the above findings. CT ABDOMEN and PELVIS FINDINGS Hepatobiliary: There are small hypodensities in the lateral and medial aspect of hepatic  segment 2 each measuring 5 mm on 3:9 and 3:16. There is a 7 mm hypodensity and a 6 mm hypodensity in  segment 4B on 3:29 and 3:36. All of these are too small to characterize, statistically most likely cysts but indeterminate. Rest of the liver enhanced uniformly with mild features of steatosis. The gallbladder and bile ducts are unremarkable. Pancreas: No mass enhancement or ductal dilatation. Spleen: Mildly enlarged measuring 16 cm in length.  No mass. Adrenals/Urinary Tract: There are occasional punctate tiny caliceal stones in the left kidney. Equivocally there may be a few punctate stones in the lower pole on the right. There is no obstructing stone or hydronephrosis. There is no adrenal or renal mass. There is symmetric renal excretion. The bladder is catheterized and contracted. No obvious focal abnormality in the bladder. Stomach/Bowel: No dilatation or overt wall thickening, including the appendix. NGT as above. Vascular/Lymphatic: Right femoral central venous line terminates in the right common iliac vein. There is a left femoral arterial line terminating in the external iliac artery. Aortic atherosclerosis. No enlarged abdominal or pelvic lymph nodes. Reproductive: No prostatomegaly. Other: There is a tiny umbilical fat hernia and bilateral small inguinal fat hernias. There is no incarcerated hernia. There is trace ascites in the posterior deep pelvis. Mild mesenteric congestive changes. Mild body wall anasarca. There is no free air, free hemorrhage or localizing inflammatory process. Musculoskeletal: Facet hypertrophy lumbar spine, most advanced at L4-5 and L5-S1. No regional skeletal fracture. Review of the MIP images confirms the above findings. IMPRESSION: 1. Acute right-sided pulmonary emboli, with small clot burden and no findings of acute right heart strain. 2. Large right tension pneumothorax with slightly increased mediastinal shift to the left and increasing depression of the right hemidiaphragm,  since today's earlier exam. 3. Dense left lower lobe consolidation, patchy nodular airspace disease in the left upper and right lower lobes, and partial atelectatic collapse of the right middle and lower lobes. 4. Emphysema with asymmetry. 5. Aortic and coronary artery atherosclerosis. 6. ETT tip 13 cm above the carina. NGT abuts the far wall of the proximal gastric body. 7. Multiple anterior rib fractures and nondisplaced body of sternum fracture, almost certainly related to CPR. 8. Mild splenomegaly. 9. Trace ascites, mild mesenteric congestive changes and body wall anasarca. 10. Nonobstructive nephrolithiasis. 11. Small hypodensities in the liver, too small to characterize, statistically most likely cysts but indeterminate. 12. PRA is attempting to reach the ordering physician for stat notification at the time of signing. Aortic Atherosclerosis (ICD10-I70.0) and Emphysema (ICD10-J43.9). Electronically Signed: By: Francis Quam M.D. On: 07/09/2024 21:11   DG CHEST PORT 1 VIEW Result Date: 07/09/2024 CLINICAL DATA:  Chest tube placement skip chest x-ray 07/08/2024 EXAM: PORTABLE CHEST 1 VIEW COMPARISON:  Chest CT 07/2024. FINDINGS: There is a new right-sided chest tube overlying the upper hemithorax. Right chest wall emphysema is present. No pneumothorax or mediastinal shift definitively seen on this x-ray. Retrocardiac consolidation and bilateral lower lobe airspace disease present. Costophrenic angles are clear. Cardiomediastinal silhouette is stable. Endotracheal tube tip is high approximately 10 cm above the carina. A enteric tube extends below the diaphragm. IMPRESSION: 1. New right-sided chest tube overlying the upper hemithorax. No pneumothorax or mediastinal shift definitively seen on this x-ray. 2. Endotracheal tube tip is high approximately 10 cm above the carina. Recommend advancement. 3. Retrocardiac consolidation and bilateral lower lobe airspace disease. Electronically Signed   By: Greig Pique  M.D.   On: 07/09/2024 23:10   CT CHEST ABDOMEN PELVIS WO CONTRAST Addendum Date: 07/09/2024 ADDENDUM #1 ADDENDUM: Coronary artery calcifications involves the LAD and appears mild. Finding regarding the enteric tube was  inadvertently omitted on the original report. Enteric tube terminates in the gastric body. ---------------------------------------------------- Electronically signed by: Manford Cummins MD 07/09/2024 05:33 PM EDT RP Workstation: [id number]   Result Date: 07/09/2024  ORIGINAL REPORT  EXAM: CT CHEST, ABDOMEN AND PELVIS WITHOUT CONTRAST 07/09/2024 02:22:02 PM TECHNIQUE: CT of the chest, abdomen and pelvis was performed without the administration of intravenous contrast. Multiplanar reformatted images are provided for review. Automated exposure control, iterative reconstruction, and/or weight based adjustment of the mA/kV was utilized to reduce the radiation dose to as low as reasonably achievable. COMPARISON: None available. CLINICAL HISTORY: Donation candidate. FINDINGS: CHEST: MEDIASTINUM: Endotracheal tube tip terminates 11.0 cm above the carina. Coronary artery calcifications. Nondisplaced sternal body fracture. THORACIC LYMPH NODES: No mediastinal, hilar or axillary lymphadenopathy. LUNGS AND PLEURA: Trace secretions within the distal trachea extending into bilateral lower lobe bronchi. Multifocal subsegmental mucus plugging. Severe paraseptal emphysema, most notable in the right upper lobe. Mild central lobular emphysema is also seen. Moderate right pneumothorax. Tree-in-bud and ground-glass nodules in the right middle lobe, left upper lobe, and right lower lobe with consolidation of the posterior right lower lobe. Complete left lower lobe atelectasis. ABDOMEN AND PELVIS: LIVER: Subcentimeter hypodensities in hepatic segment 2 (3:71, 80), too small to characterize but likely cysts. GALLBLADDER AND BILE DUCTS: Gallbladder is unremarkable. No biliary ductal dilatation. SPLEEN: Enlarged spleen measures  15.6 cm. PANCREAS: No acute abnormality. ADRENAL GLANDS: No acute abnormality. KIDNEYS, URETERS AND BLADDER: Nonobstructing bilateral renal stones. No hydronephrosis. Urinary bladder is decompressed with catheter in situ. GI AND BOWEL: Normal appendix. No abnormal bowel dilation or mural thickening. REPRODUCTIVE ORGANS: No acute abnormality. PERITONEUM AND RETROPERITONEUM: No ascites. No free air. Small fat-containing left inguinal hernia. VASCULATURE: Right femoral approach catheter terminates in the right common iliac vein. Atherosclerosis. ABDOMINAL AND PELVIS LYMPH NODES: No lymphadenopathy. BONES AND SOFT TISSUES: Mildly displaced fracture of the right anterior second through fourth ribs and nondisplaced fractures of the right third through seventh costal cartilages and left third through fifth costal cartilages. Mild body wall edema. Small focus of subcutaneous emphysema within the right anterior abdominal wall, which may be injection related. Multilevel degenerative changes of the imaged spine. Ovoid cystic focus measuring 1.6 cm in the right axilla may represent a ganglion cyst. IMPRESSION: 1. Nondisplaced sternal body, multiple right rib and bilateral costal cartilage fractures with moderate right pneumothorax, likely secondary to resuscitation. 2. Multifocal subsegmental mucus plugging, tree-in-bud and ground-glass nodules, and consolidation of the posterior right lower lobe, likely multifocal aspiration and pneumonia. 3. Complete left lower lobe atelectasis. 4. Nonobstructing bilateral renal stones. Electronically signed by: Manford Cummins MD 07/09/2024 03:00 PM EDT RP Workstation: [id number]   ECHOCARDIOGRAM COMPLETE Result Date: 07/09/2024    ECHOCARDIOGRAM REPORT   Patient Name:   Paxtyn Boyar Date of Exam: 07/09/2024 Medical Rec #:  [id number]     Height:       76.0 in Accession #:    [id number]    Weight:       203.0 lb Date of Birth:  1966/10/08      BSA:          2.229 m Patient Age:    58 years      BP:            124/59 mmHg Patient Gender: M             HR:           90 bpm. Exam Location:  Inpatient Procedure: 2D Echo, Cardiac Doppler and  Color Doppler (Both Spectral and Color            Flow Doppler were utilized during procedure). Indications:    Organ donation  History:        Patient has prior history of Echocardiogram examinations, most                 recent 07/05/2024.  Sonographer:    Meagan Baucom RDCS, FE, PE Referring Phys: Santiana Glidden N Meliah Appleman IMPRESSIONS  1. Left ventricular ejection fraction, by estimation, is 70 to 75%. The left ventricle has hyperdynamic function. The left ventricle has no regional wall motion abnormalities. Left ventricular diastolic parameters were normal.  2. Right ventricular systolic function is normal. The right ventricular size is normal.  3. The mitral valve is normal in structure. Trivial mitral valve regurgitation.  4. The aortic valve is tricuspid. Aortic valve regurgitation is not visualized. Aortic valve sclerosis/calcification is present, without any evidence of aortic stenosis.  5. The inferior vena cava is dilated in size with <50% respiratory variability, suggesting right atrial pressure of 15 mmHg. FINDINGS  Left Ventricle: Left ventricular ejection fraction, by estimation, is 70 to 75%. The left ventricle has hyperdynamic function. The left ventricle has no regional wall motion abnormalities. The left ventricular internal cavity size was normal in size. There is no left ventricular hypertrophy. Left ventricular diastolic parameters were normal. Right Ventricle: The right ventricular size is normal. Right vetricular wall thickness was not assessed. Right ventricular systolic function is normal. Left Atrium: Left atrial size was normal in size. Right Atrium: Right atrial size was normal in size. Pericardium: Trivial pericardial effusion is present. Mitral Valve: The mitral valve is normal in structure. Trivial mitral valve regurgitation. Tricuspid Valve: The tricuspid  valve is normal in structure. Tricuspid valve regurgitation is trivial. Aortic Valve: The aortic valve is tricuspid. Aortic valve regurgitation is not visualized. Aortic valve sclerosis/calcification is present, without any evidence of aortic stenosis. Aortic valve mean gradient measures 5.0 mmHg. Aortic valve peak gradient measures 8.8 mmHg. Aortic valve area, by VTI measures 4.38 cm. Pulmonic Valve: The pulmonic valve was not well visualized. Pulmonic valve regurgitation is not visualized. No evidence of pulmonic stenosis. Aorta: The aortic root is normal in size and structure. Venous: The inferior vena cava is dilated in size with less than 50% respiratory variability, suggesting right atrial pressure of 15 mmHg. IAS/Shunts: No atrial level shunt detected by color flow Doppler.  LEFT VENTRICLE PLAX 2D LVIDd:         5.40 cm   Diastology LVIDs:         3.60 cm   LV e' medial:    11.60 cm/s LV PW:         1.00 cm   LV E/e' medial:  9.9 LV IVS:        1.00 cm   LV e' lateral:   5.87 cm/s LVOT diam:     2.40 cm   LV E/e' lateral: 19.6 LV SV:         108 LV SV Index:   48 LVOT Area:     4.52 cm  RIGHT VENTRICLE RV S prime:     17.00 cm/s TAPSE (M-mode): 1.8 cm LEFT ATRIUM             Index        RIGHT ATRIUM           Index LA Vol (A2C):   35.2 ml 15.79 ml/m  RA Area:  12.40 cm LA Vol (A4C):   55.2 ml 24.76 ml/m  RA Volume:   27.10 ml  12.16 ml/m LA Biplane Vol: 46.5 ml 20.86 ml/m  AORTIC VALVE AV Area (Vmax):    4.55 cm AV Area (Vmean):   4.62 cm AV Area (VTI):     4.38 cm AV Vmax:           148.00 cm/s AV Vmean:          97.200 cm/s AV VTI:            0.246 m AV Peak Grad:      8.8 mmHg AV Mean Grad:      5.0 mmHg LVOT Vmax:         149.00 cm/s LVOT Vmean:        99.200 cm/s LVOT VTI:          0.238 m LVOT/AV VTI ratio: 0.97  AORTA Ao Root diam: 3.40 cm MITRAL VALVE MV Area (PHT): 3.39 cm     SHUNTS MV Decel Time: 224 msec     Systemic VTI:  0.24 m MV E velocity: 115.00 cm/s  Systemic Diam: 2.40 cm MV  A velocity: 76.50 cm/s MV E/A ratio:  1.50 Vina Gull MD Electronically signed by Vina Gull MD Signature Date/Time: 07/09/2024/1:40:39 PM    Final    MR BRAIN WO CONTRAST Addendum Date: 07/08/2024 ADDENDUM REPORT: 07/08/2024 15:06 ADDENDUM: Study discussed by telephone with NP DEVON SHAFER on 07/08/2024 at 15:06 . Electronically Signed   By: VEAR Hurst M.D.   On: 07/08/2024 15:06   Result Date: 07/08/2024 CLINICAL DATA:  58 year old male with altered mental status, seizures after Cardiopulmonary arrest and resuscitation. EXAM: MRI HEAD WITHOUT CONTRAST TECHNIQUE: Multiplanar, multiecho pulse sequences of the brain and surrounding structures were obtained without intravenous contrast. COMPARISON:  Head CT 07/05/2024. FINDINGS: Brain: Diffusely abnormal diffusion of gray matter structures throughout the brain, but especially severely restricted diffusion throughout the cerebellum (series 3, image 18) with diffuse cerebellar gray matter edema. Associated posterior fossa mass effect with cerebellar tonsillar herniation (up to 14 mm series 5, image 14), and effaced cisterna magna which are new from the CT 07/05/2024. Associated mild to moderate lateral and 3rd ventriculomegaly with newly dilated temporal horns and associated transependymal edema (series 2, image 21) but also pronounced abnormal generalized cerebral gray matter T2 and FLAIR hyperintensity, abnormal diffusion. Deep gray matter structures appear symmetrically affected. Suprasellar cistern effaced. No acute or chronic intracranial hemorrhage identified on conventional images and SWI. No significant midline shift. Vascular: Major intracranial vascular flow voids are preserved. Skull and upper cervical spine: Negative. Sinuses/Orbits: Intubated. Widespread paranasal sinus mucosal thickening and fluid. Other: Mild mastoid effusions. IMPRESSION: 1. Severe and diffuse Anoxic Brain Injury, with especially severe cerebellar edema resulting in tonsillar herniation,  obstruction of the cisterna magna, and ventriculomegaly with transependymal edema. 2. No intracranial hemorrhage or midline shift at this time. Electronically Signed: By: VEAR Hurst M.D. On: 07/08/2024 14:55   DG CHEST PORT 1 VIEW Result Date: 07/08/2024 EXAM: 1 VIEW XRAY OF THE CHEST 07/08/2024 06:53:12 AM COMPARISON: 1 view chest x-ray 07/05/2024. CLINICAL HISTORY: Hypoxia 200808. ETT,HYPOXIA; ROVER FINDINGS: LUNGS AND PLEURA: New consolidated airspace disease is present at the left lower lobe. Emphysematous changes are again noted. No pleural effusion. No pneumothorax. HEART AND MEDIASTINUM: No acute abnormality of the cardiac and mediastinal silhouettes. BONES AND SOFT TISSUES: No acute osseous abnormality. Endotracheal tube has been pulled back and now terminates above the clavicles, 13  mm from the carina. Gastric tube courses along the inferior border of the film. IMPRESSION: 1. New consolidated airspace disease in the left lower lobe is concerning for infection or aspiration . 2. Emphysematous changes. Electronically signed by: Lonni Necessary MD 07/08/2024 07:02 AM EDT RP Workstation: [id number]   Overnight EEG with video Result Date: 07/06/2024 Maurine Jurist, MD     07/06/2024 11:46 AM Continuous Video-EEG Monitoring Report CPT/Type of Study: 95720 (EEG with video, 12-26 hours)     Indications for Procedure: Altered mental status and seizure activity Primary neurological diagnosis: G93.40; R56.9 Duration of study: 07/05/2024 08:52 to 07/06/2024 07:30 History: This is a 58 year old male presenting with altered mental status and seizure activity in the context of anaphylactic shock. Continuous video-EEG monitoring was performed to evaluate for seizure. Technical Description: The EEG was performed using standard setting per the guidelines of American Clinical Neurophysiology Society (ACNS). A minimum of 21 electrodes were placed on scalp according to the International 10-20 or/and 10-10 Systems. Supplemental  electrodes were placed as needed. Single EKG electrode was also used to detect cardiac arrhythmia. Patient's behavior was continuously recorded on video simultaneously with EEG. A minimum of 16 channels were used for data display. Each epoch of study was reviewed manually daily and as needed using standard referential and bipolar montages. Computerized quantitative EEG analysis (such as compressed spectral array analysis, trending, automated spike & seizure detection) were used as indicated. EEG Description: Epoch: 07/05/2024 08:52 to 07/06/2024 07:30 Background: There was burst suppression, characterized by background suppression with intermittent generalized bursts of sharply contoured polymorphic activity. The duration of the bursts varied from ~1-30 seconds. After around midnight, there was complete suppression. No posterior dominant rhythm or sleep architecture was recorded. PERIODIC OR RHYTHMIC PATTERNS: Occasional bursts of generalized periodic discharges (GPDs) or generalized periodic epileptiform discharges were present in latter part of the epoch. EPILEPTIFORM DISCHARGES: Spikes and sharp waves were present in many of the generalized bursts. SEIZURES: None. EVENTS: None. EKG: No data available. Impression: This is an abnormal EEG due to the presence of the following: 1) Burst suppression pattern which progress to complete suppression after midnight, suggesting severe encephalopathy, which is nonspecific as to etiology (medication effect of propofol  and benzodiazepine could not be ruled out); 2) Occasional generalized epileptiform discharges, suggesting diffuse cortical irritability and risk of generalized seizures. No seizure is present.     ECHOCARDIOGRAM COMPLETE Result Date: 07/05/2024    ECHOCARDIOGRAM REPORT   Patient Name:   DENNY MCCREE Date of Exam: 07/05/2024 Medical Rec #:  [id number]     Height:       76.0 in Accession #:    [id number]    Weight:       200.0 lb Date of Birth:  [birth year]      BSA:           2.215 m Patient Age:    58 years      BP:           162/100 mmHg Patient Gender: M             HR:           105 bpm. Exam Location:  Inpatient Procedure: 2D Echo, Cardiac Doppler, Color Doppler and Intracardiac            Opacification Agent (Both Spectral and Color Flow Doppler were            utilized during procedure). Indications:    Shock  History:  Patient has no prior history of Echocardiogram examinations.  Sonographer:    Ellouise Mose RDCS Referring Phys: [id number] JODELLE DASEN [facility]  Sonographer Comments: Technically difficult study due to poor echo windows, suboptimal parasternal window and echo performed with patient supine and on artificial respirator. No prior cardiac history. IMPRESSIONS  1. Left ventricular ejection fraction, by estimation, is 70 to 75%. The left ventricle has hyperdynamic function. Left ventricular endocardial border not optimally defined to evaluate regional wall motion. Left ventricular diastolic parameters were normal.  2. Right ventricular systolic function is normal. The right ventricular size is normal.  3. The mitral valve is normal in structure. No evidence of mitral valve regurgitation. No evidence of mitral stenosis.  4. The aortic valve was not well visualized. Aortic valve regurgitation is not visualized. No aortic stenosis is present. FINDINGS  Left Ventricle: Left ventricular ejection fraction, by estimation, is 70 to 75%. The left ventricle has hyperdynamic function. Left ventricular endocardial border not optimally defined to evaluate regional wall motion. Definity  contrast agent was given IV to delineate the left ventricular endocardial borders. The left ventricular internal cavity size was normal in size. There is no left ventricular hypertrophy. Left ventricular diastolic parameters were normal. Right Ventricle: The right ventricular size is normal. No increase in right ventricular wall thickness. Right ventricular systolic function is normal. Left Atrium:  Left atrial size was normal in size. Right Atrium: Right atrial size was normal in size. Pericardium: There is no evidence of pericardial effusion. Mitral Valve: The mitral valve is normal in structure. No evidence of mitral valve regurgitation. No evidence of mitral valve stenosis. Tricuspid Valve: The tricuspid valve is normal in structure. Tricuspid valve regurgitation is not demonstrated. No evidence of tricuspid stenosis. Aortic Valve: The aortic valve was not well visualized. Aortic valve regurgitation is not visualized. No aortic stenosis is present. Pulmonic Valve: The pulmonic valve was not well visualized. Pulmonic valve regurgitation is not visualized. No evidence of pulmonic stenosis. Aorta: The aortic root is normal in size and structure. Venous: IVC assessment for right atrial pressure unable to be performed due to mechanical ventilation. IAS/Shunts: No atrial level shunt detected by color flow Doppler.  LEFT VENTRICLE PLAX 2D LVIDd:         5.10 cm     Diastology LVIDs:         3.90 cm     LV e' medial:    5.87 cm/s LV PW:         1.10 cm     LV E/e' medial:  9.6 LV IVS:        1.10 cm     LV e' lateral:   5.33 cm/s LVOT diam:     2.20 cm     LV E/e' lateral: 10.5 LV SV:         40 LV SV Index:   18 LVOT Area:     3.80 cm  LV Volumes (MOD) LV vol d, MOD A2C: 43.8 ml LV vol d, MOD A4C: 44.4 ml LV vol s, MOD A2C: 14.0 ml LV vol s, MOD A4C: 15.0 ml LV SV MOD A2C:     29.8 ml LV SV MOD A4C:     44.4 ml LV SV MOD BP:      29.2 ml RIGHT VENTRICLE             IVC RV S prime:     17.60 cm/s  IVC diam: 2.80 cm TAPSE (M-mode): 1.6 cm LEFT ATRIUM  Index       RIGHT ATRIUM          Index LA diam:      2.70 cm 1.22 cm/m  RA Area:     8.25 cm LA Vol (A2C): 11.0 ml 4.97 ml/m  RA Volume:   14.90 ml 6.73 ml/m LA Vol (A4C): 18.1 ml 8.17 ml/m  AORTIC VALVE LVOT Vmax:   93.30 cm/s LVOT Vmean:  58.700 cm/s LVOT VTI:    0.104 m  AORTA Ao Root diam: 3.40 cm MITRAL VALVE MV Area (PHT): 3.85 cm    SHUNTS MV  Decel Time: 197 msec    Systemic VTI:  0.10 m MV E velocity: 56.10 cm/s  Systemic Diam: 2.20 cm MV A velocity: 58.20 cm/s MV E/A ratio:  0.96 Morene Brownie Electronically signed by Morene Brownie Signature Date/Time: 07/05/2024/1:02:26 PM    Final    DG CHEST PORT 1 VIEW Result Date: 07/05/2024 CLINICAL DATA:  Endotracheal tube placement. EXAM: PORTABLE CHEST 1 VIEW COMPARISON:  07/04/2024 FINDINGS: Endotracheal tip is 9.3 cm above the carina. Enteric tube has tip and side-port over the stomach in the left upper quadrant. 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 No acute airspace process, effusion or pneumothorax. Cardiomediastinal silhouette and remainder of the exam is unchanged. IMPRESSION: 1. No acute cardiopulmonary disease. 2. Endotracheal tube with tip 9.3 cm above the carina. Enteric tube with tip and side-port over the stomach in the left upper quadrant. Electronically Signed   By: Toribio Agreste M.D.   On: 07/05/2024 11:12   CT HEAD WO CONTRAST ( ) Result Date: 07/05/2024 EXAM: CT HEAD WITHOUT 07/05/2024 01:23:00 AM TECHNIQUE: CT of the head was performed without the administration of intravenous contrast. Automated exposure control, iterative reconstruction, and/or weight based adjustment of the mA/kV was utilized to reduce the radiation dose to as low as reasonably achievable. COMPARISON: None available. CLINICAL HISTORY: Altered mental status, nontraumatic (Ped 0-17y). Chief complaints; unresponsive; Allergic Reaction. FINDINGS: BRAIN AND VENTRICLES: No acute intracranial hemorrhage. No mass effect or midline shift. No extra-axial fluid collection. Gray-white differentiation is maintained. No hydrocephalus. ORBITS: No acute abnormality. SINUSES AND MASTOIDS: No acute abnormality. SOFT TISSUES AND SKULL: No acute skull fracture. No acute soft tissue abnormality. IMPRESSION: 1. No acute intracranial abnormality. Electronically signed by: Franky Stanford MD 07/05/2024 01:48 AM EDT RP Workstation: [id number]   DG Chest Port 1 View Result Date: 07/04/2024 EXAM: 1 VIEW XRAY OF THE CHEST 07/04/2024 09:13:00 PM COMPARISON: None available. CLINICAL HISTORY: Tube placement. Line placement. FINDINGS: LUNGS AND PLEURA: Scarring and bullous change in the right upper lung. The lungs are otherwise clear. No pleural effusion or pneumothorax. HEART AND MEDIASTINUM: No acute abnormality of the cardiac and mediastinal silhouettes. Endotracheal tube in appropriate position. Subdiaphragmatic enteric tube BONES AND SOFT TISSUES: No displaced rib fracture. Defibrillator pads overlying the chest. IMPRESSION: 1. No acute process. 2. Endotracheal tube in appropriate position. Subdiaphragmatic enteric tube 3. Scarring and bullous change in the right upper lung. Electronically signed by: Norman Gatlin MD 07/04/2024 09:19 PM EDT RP Workstation: [id number]    Microbiology: Recent Results (from the past 240 hours)  MRSA Next Gen by PCR, Nasal     Status: None   Collection Time: 07/05/24 12:39 AM   Specimen: Nasal Mucosa; Nasal Swab  Result Value Ref Range Status   MRSA by PCR Next Gen NOT DETECTED NOT DETECTED Final    Comment: (NOTE) The GeneXpert MRSA Assay (FDA approved for  NASAL specimens only), is one component of a comprehensive MRSA colonization surveillance program. It is not intended to diagnose MRSA infection nor to guide or monitor treatment for MRSA infections. Test performance is not FDA approved in patients less than 70 years old. Performed at [facility], [address] [place], [place] [address]   Culture, blood (Routine X 2) w Reflex to ID Panel     Status: None   Collection Time: 07/05/24  9:39 AM   Specimen: BLOOD LEFT HAND  Result Value Ref Range Status   Specimen Description BLOOD LEFT HAND  Final   Special Requests   Final    BOTTLES DRAWN AEROBIC AND ANAEROBIC Blood Culture adequate volume   Culture   Final    NO GROWTH 5  DAYS Performed at [facility], [address], [place], [place] [address]    Report Status 07-26-24 FINAL  Final  Culture, blood (Routine X 2) w Reflex to ID Panel     Status: None   Collection Time: 07/05/24  9:50 AM   Specimen: BLOOD RIGHT HAND  Result Value Ref Range Status   Specimen Description BLOOD RIGHT HAND  Final   Special Requests   Final    BOTTLES DRAWN AEROBIC ONLY Blood Culture adequate volume   Culture   Final    NO GROWTH 5 DAYS Performed at [facility], [address] [place], [place] [address]    Report Status Jul 26, 2024 FINAL  Final  SARS Coronavirus 2 by RT PCR (hospital order, performed in [facility] hospital lab) *cepheid single result test* Anterior Nasal Swab     Status: None   Collection Time: 07/09/24  3:32 AM   Specimen: Anterior Nasal Swab  Result Value Ref Range Status   SARS Coronavirus 2 by RT PCR NEGATIVE NEGATIVE Final    Comment: Performed at [facility], [address] [place], [place] [address]  Urine Culture (for pregnant, neutropenic or urologic patients or patients with an indwelling urinary catheter)     Status: None   Collection Time: 07/09/24  3:32 AM   Specimen: Urine, Catheterized  Result Value Ref Range Status   Specimen Description URINE, CATHETERIZED  Final   Special Requests Normal  Final   Culture   Final    NO GROWTH Performed at [facility], [address], [place], [place] [address]    Report Status Jul 26, 2024 FINAL  Final  Culture, blood (Routine X 2) w Reflex to ID Panel     Status: None (Preliminary result)   Collection Time: 07/09/24  3:46 AM   Specimen: BLOOD LEFT HAND  Result Value Ref Range Status   Specimen Description BLOOD LEFT HAND  Final   Special Requests   Final    BOTTLES DRAWN AEROBIC AND ANAEROBIC Blood Culture adequate volume   Culture   Final    NO GROWTH 1 DAY Performed at [facility], [address], [place], [place] [address]    Report Status PENDING   Incomplete  Culture, blood (Routine X 2) w Reflex to ID Panel     Status: None (Preliminary result)   Collection Time: 07/09/24  3:54 AM   Specimen: BLOOD RIGHT HAND  Result Value Ref Range Status   Specimen Description BLOOD RIGHT HAND  Final   Special Requests   Final    BOTTLES DRAWN AEROBIC AND ANAEROBIC Blood Culture adequate volume   Culture   Final  NO GROWTH 1 DAY Performed at [facility], [address] [place], [place] [address]    Report Status PENDING  Incomplete  Expectorated Sputum Assessment w Gram Stain, Rflx to Resp Cult     Status: None   Collection Time: 07/09/24  9:38 PM   Specimen: Expectorated Sputum  Result Value Ref Range Status   Specimen Description EXPECTORATED SPUTUM  Final   Special Requests NONE  Final   Sputum evaluation   Final    THIS SPECIMEN IS ACCEPTABLE FOR SPUTUM CULTURE Performed at [facility], [address], [place], [place] [address]    Report Status 07/09/2024 FINAL  Final  Culture, Respiratory w Gram Stain     Status: None (Preliminary result)   Collection Time: 07/09/24  9:38 PM  Result Value Ref Range Status   Specimen Description EXPECTORATED SPUTUM  Final   Special Requests NONE Reflexed from [id number]  Final   Gram Stain   Final    FEW WBC PRESENT, PREDOMINANTLY PMN MODERATE GRAM NEGATIVE RODS    Culture   Final    ABUNDANT PSEUDOMONAS AERUGINOSA SUSCEPTIBILITIES TO FOLLOW Performed at [facility], [address] [place], [place] [address]    Report Status PENDING  Incomplete     Time spent: 66 minutes  The patient is critically ill due to acute hypoxic respiratory failure. Time spent titrating ventilator settings and vasopressor drips.  Critical care was necessary to treat or prevent imminent or life-threatening deterioration. Critical care time was spent by me on the following activities: development of a treatment plan with the patient and/or surrogate as well as nursing, discussions with  consultants, evaluation of the patient's response to treatment, examination of the patient, obtaining a history from the patient or surrogate, ordering and performing treatments and interventions, ordering and review of laboratory studies, ordering and review of radiographic studies, review of telemetry data including pulse oximetry, re-evaluation of patient's condition and participation in multidisciplinary rounds.     Zola LOISE Herter, MD [facility] 07/12/24 3:22 PM    Signed: Zola LOISE Herter, MD 07-12-2024

## 2024-08-02 NOTE — Progress Notes (Signed)
 CCM NP notified of ABG

## 2024-08-02 NOTE — Progress Notes (Signed)
 Chaplain paged by staff as pt is nearing end of life and has many loved ones present. Chaplain introduces self and services to family and provides compassionate presence, then engages loved ones in pastoral conversation. Spouse expresses anger with God and a desire for pt to live until 5 p.m. so his organs can be given to someone in need. Chaplain provides prayer.

## 2024-08-02 NOTE — Progress Notes (Signed)
 ABG given to CCM NP in person.

## 2024-08-02 DEATH — deceased
